# Patient Record
Sex: Female | Born: 1980 | Race: Black or African American | Hispanic: No | Marital: Single | Smoking: Current every day smoker
Health system: Southern US, Community
[De-identification: ages and names within clinical notes are randomized; demographics above are authoritative.]

## PROBLEM LIST (undated history)

## (undated) DIAGNOSIS — E119 Type 2 diabetes mellitus without complications: Secondary | ICD-10-CM

## (undated) DIAGNOSIS — E785 Hyperlipidemia, unspecified: Secondary | ICD-10-CM

## (undated) HISTORY — DX: Type 2 diabetes mellitus without complications: E11.9

## (undated) HISTORY — DX: Hyperlipidemia, unspecified: E78.5

---

## 2001-06-20 ENCOUNTER — Emergency Department (HOSPITAL_COMMUNITY): Admission: EM | Admit: 2001-06-20 | Discharge: 2001-06-20 | Payer: Self-pay | Admitting: Emergency Medicine

## 2001-09-28 ENCOUNTER — Emergency Department (HOSPITAL_COMMUNITY): Admission: EM | Admit: 2001-09-28 | Discharge: 2001-09-28 | Payer: Self-pay | Admitting: Emergency Medicine

## 2002-04-04 ENCOUNTER — Other Ambulatory Visit: Admission: RE | Admit: 2002-04-04 | Discharge: 2002-04-04 | Payer: Self-pay

## 2002-06-11 ENCOUNTER — Emergency Department (HOSPITAL_COMMUNITY): Admission: EM | Admit: 2002-06-11 | Discharge: 2002-06-11 | Payer: Self-pay | Admitting: Internal Medicine

## 2002-06-21 ENCOUNTER — Encounter (HOSPITAL_COMMUNITY): Admission: RE | Admit: 2002-06-21 | Discharge: 2002-07-21 | Payer: Self-pay | Admitting: Preventative Medicine

## 2003-08-01 ENCOUNTER — Emergency Department (HOSPITAL_COMMUNITY): Admission: EM | Admit: 2003-08-01 | Discharge: 2003-08-01 | Payer: Self-pay | Admitting: Emergency Medicine

## 2003-12-04 ENCOUNTER — Emergency Department (HOSPITAL_COMMUNITY): Admission: EM | Admit: 2003-12-04 | Discharge: 2003-12-04 | Payer: Self-pay | Admitting: Emergency Medicine

## 2004-04-18 ENCOUNTER — Ambulatory Visit (HOSPITAL_COMMUNITY): Admission: AD | Admit: 2004-04-18 | Discharge: 2004-04-18 | Payer: Self-pay | Admitting: Obstetrics and Gynecology

## 2004-05-01 ENCOUNTER — Ambulatory Visit (HOSPITAL_COMMUNITY): Admission: AD | Admit: 2004-05-01 | Discharge: 2004-05-01 | Payer: Self-pay | Admitting: Obstetrics and Gynecology

## 2004-05-02 ENCOUNTER — Inpatient Hospital Stay (HOSPITAL_COMMUNITY): Admission: AD | Admit: 2004-05-02 | Discharge: 2004-05-03 | Payer: Self-pay | Admitting: Obstetrics and Gynecology

## 2004-10-05 ENCOUNTER — Emergency Department (HOSPITAL_COMMUNITY): Admission: EM | Admit: 2004-10-05 | Discharge: 2004-10-05 | Payer: Self-pay | Admitting: Emergency Medicine

## 2005-08-11 ENCOUNTER — Emergency Department (HOSPITAL_COMMUNITY): Admission: EM | Admit: 2005-08-11 | Discharge: 2005-08-11 | Payer: Self-pay | Admitting: Emergency Medicine

## 2006-12-22 ENCOUNTER — Emergency Department (HOSPITAL_COMMUNITY): Admission: EM | Admit: 2006-12-22 | Discharge: 2006-12-22 | Payer: Self-pay | Admitting: Emergency Medicine

## 2007-01-12 ENCOUNTER — Ambulatory Visit (HOSPITAL_COMMUNITY): Admission: RE | Admit: 2007-01-12 | Discharge: 2007-01-12 | Payer: Self-pay | Admitting: Obstetrics & Gynecology

## 2007-02-23 ENCOUNTER — Emergency Department (HOSPITAL_COMMUNITY): Admission: EM | Admit: 2007-02-23 | Discharge: 2007-02-24 | Payer: Self-pay | Admitting: Emergency Medicine

## 2007-08-09 ENCOUNTER — Other Ambulatory Visit: Admission: RE | Admit: 2007-08-09 | Discharge: 2007-08-09 | Payer: Self-pay | Admitting: Obstetrics and Gynecology

## 2007-09-04 ENCOUNTER — Emergency Department (HOSPITAL_COMMUNITY): Admission: EM | Admit: 2007-09-04 | Discharge: 2007-09-04 | Payer: Self-pay | Admitting: Emergency Medicine

## 2008-08-23 ENCOUNTER — Other Ambulatory Visit: Admission: RE | Admit: 2008-08-23 | Discharge: 2008-08-23 | Payer: Self-pay | Admitting: Obstetrics & Gynecology

## 2009-10-14 ENCOUNTER — Other Ambulatory Visit: Admission: RE | Admit: 2009-10-14 | Discharge: 2009-10-14 | Payer: Self-pay | Admitting: Obstetrics and Gynecology

## 2010-06-24 ENCOUNTER — Emergency Department (HOSPITAL_COMMUNITY): Admission: EM | Admit: 2010-06-24 | Discharge: 2010-06-24 | Payer: Self-pay | Admitting: Emergency Medicine

## 2011-01-06 ENCOUNTER — Other Ambulatory Visit: Payer: Self-pay | Admitting: Obstetrics & Gynecology

## 2011-01-06 ENCOUNTER — Other Ambulatory Visit (HOSPITAL_COMMUNITY)
Admission: RE | Admit: 2011-01-06 | Discharge: 2011-01-06 | Disposition: A | Discharge status: Home / Self Care | Payer: Medicaid Other | Source: Ambulatory Visit | Attending: Obstetrics & Gynecology | Admitting: Obstetrics & Gynecology

## 2011-01-06 DIAGNOSIS — Z01419 Encounter for gynecological examination (general) (routine) without abnormal findings: Secondary | ICD-10-CM | POA: Insufficient documentation

## 2011-01-27 ENCOUNTER — Emergency Department (HOSPITAL_COMMUNITY)
Admission: EM | Admit: 2011-01-27 | Discharge: 2011-01-27 | Disposition: A | Discharge status: Home / Self Care | Payer: Medicaid Other | Attending: Emergency Medicine | Admitting: Emergency Medicine

## 2011-01-27 DIAGNOSIS — J020 Streptococcal pharyngitis: Secondary | ICD-10-CM | POA: Insufficient documentation

## 2011-01-27 DIAGNOSIS — R61 Generalized hyperhidrosis: Secondary | ICD-10-CM | POA: Insufficient documentation

## 2011-01-27 DIAGNOSIS — R509 Fever, unspecified: Secondary | ICD-10-CM | POA: Insufficient documentation

## 2011-01-27 LAB — URINALYSIS, ROUTINE W REFLEX MICROSCOPIC
Glucose, UA: NEGATIVE mg/dL
Ketones, ur: NEGATIVE mg/dL
Leukocytes, UA: NEGATIVE
Nitrite: NEGATIVE
pH: 6.5 (ref 5.0–8.0)

## 2011-01-27 LAB — POCT PREGNANCY, URINE: Preg Test, Ur: NEGATIVE

## 2011-01-27 LAB — URINE MICROSCOPIC-ADD ON

## 2011-01-30 LAB — POCT PREGNANCY, URINE: Preg Test, Ur: NEGATIVE

## 2011-04-03 NOTE — Discharge Summary (Signed)
Brittney Caldwell, Brittney Caldwell                           ACCOUNT NO.:  [id number]   MEDICAL RECORD NO.:  [id number]                   PATIENT TYPE:  INP   LOCATION:  A416                                 FACILITY:  [facility]   PHYSICIAN:  Tilda Burrow, M.D.              DATE OF BIRTH:  [birth year]   DATE OF ADMISSION:  05/01/2004  DATE OF DISCHARGE:  05/03/2004                                 DISCHARGE SUMMARY   ADMISSION DIAGNOSES:  Pregnancy 28 weeks 5 days, recurrent preterm labor.   DISCHARGE DIAGNOSES:  Pregnancy 28 weeks 5 days, recurrent preterm labor.  Premature preterm rupture of membranes.  Trichomoniasis vaginitis.   DISPOSITION:  Transfer to [facility], [place] for delivery  management.   HOSPITAL SUMMARY:  This 30 year old female, gravida 2, para 2-0-1-0, due  February 11, 2004 was admitted to labor and delivery just before midnight on  the evening of May 01, 2004 after presenting with uterine contractions and  pain described as a level of 8/10.  No bleeding, membrane rupture or fever.  The patient had been discharged from [facility] on April 30, 2004 where she had been an inpatient since April 18, 2004 due to preterm  labor noted on office visit with cervical dilation to 4 cm.  She had been  treated until group B strep cultures returned negative and magnesium sulfate  was given for tocolytics and she had received betamethasone for lung  maturity enhancement.  She had presented on the morning of May 01, 2004 to  our office for resumption of care, was sent to labor and delivery with  contractions were noted at noon on May 01, 2004.  Unfortunately, she went  home and returned 12 hours later with pain described as 8/10.  Cervical exam  showed the cervix to be 2 cm, 80%, -1 vertex with membranes intact.  She was  admitted for a repeat tocolysis.   HOSPITAL COURSE:  The patient was admitted, afebrile with laboratory data  including a white count of 15,500 with 80  neutrophils and a fetal heart rate  in the 140's, afebrile.  Temperature 98.8.  External monitoring showed  uterine contractions which responded to magnesium sulfate tocolysis which  was initiated at 2 g per hour after a 4 g loading dose.  This was increased  to 3 g per hour and the patient had improved control of uterine contractions  which were still infrequent every five minutes, untenable with type  perceived as a 3/10 by the patient.  She was placed on antibiotics after a  group B strep culture obtained.  Group B strep culture returned positive for  GBS.  This returned after the patient was discharged.   The patient had approximately 24 hours of observation and then developed a  heavy vaginal discharge.  Microscopic exam was obtained and showed perfuse  Trichomoniasis.  She went onto spontaneously rupture shortly after  collection of the wet prep specimen.  She was subsequently confirmed as  being nitrazine positive with clear generous fluid.  Ultrasound had  previously confirmed vertex presentation.  Contact was made with [facility] where Dr. Conni Slipper accepted the patient in  transfer.  She was transferred on continued magnesium sulfate at 3 g per  hour and for continued management and vaginal delivery.   ADDENDUM:  The patient's white count improved during her stay with second  CBC on hospital day #2 showing a white count of 12,200 but with continued  leukocytosis with 83 neutrophils, no bands.     ___________________________________________                                         Tilda Burrow, M.D.   JVF/MEDQ  D:  05/15/2004  T:  05/15/2004  Job:  [id number]

## 2011-04-03 NOTE — Op Note (Signed)
Brittney Caldwell, Brittney Caldwell                 ACCOUNT NO.:  [id number]   MEDICAL RECORD NO.:  [id number]          PATIENT TYPE:  AMB   LOCATION:  DAY                           FACILITY:  [facility]   PHYSICIAN:  Lazaro Arms, M.D.   DATE OF BIRTH:  1980-12-17   DATE OF PROCEDURE:  01/12/2007  DATE OF DISCHARGE:                               OPERATIVE REPORT   PREOPERATIVE DIAGNOSIS:  High grade squamous intraepithelial lesion.   POSTOPERATIVE DIAGNOSIS:  High grade squamous intraepithelial lesion.   OPERATION PERFORMED:  Laser ablation of the cervix.   SURGEON:  Lazaro Arms, M.D.   ANESTHESIA:  General endotracheal.   FINDINGS:  The patient had a colposcopic directed biopsies done in the  office which showed a lesion in the squamocolumnar junction close to the  endocervical canal consistent with high grade dysplasia.  As a result,  she is admitted for laser ablation of the cervix.  Today this was  confirmed by repeat colposcopy.   DESCRIPTION OF PROCEDURE:  The patient was taken to the operating room  and placed in the supine position, underwent general endotracheal  anesthesia, placed in dorsal lithotomy position.  Graves speculum was  placed.  Colposcopy was performed using the microscope and 3% acetic  acid.  The previous changes were seen.  The holmium laser was used and  placed in a rate of 20 and a power of 1.5 and a large margin was  obtained around the cervical dysplasia, 5 mm coning down to about 9 mm  centrally ina conical fashion.  The patient tolerated the procedure  well.  She had no blood loss and was taken to the recovery room in good  and stable condition.  All counts correct.      Lazaro Arms, M.D.  Electronically Signed     LHE/MEDQ  D:  01/12/2007  T:  01/12/2007  Job:  [id number]

## 2011-04-03 NOTE — H&P (Signed)
Brittney Caldwell, Brittney Caldwell                           ACCOUNT NO.:  [id number]   MEDICAL RECORD NO.:  [id number]                   PATIENT TYPE:  INP   LOCATION:  A416                                 FACILITY:  [facility]   PHYSICIAN:  Tilda Burrow, M.D.              DATE OF BIRTH:  [birth year]   DATE OF ADMISSION:  05/01/2004  DATE OF DISCHARGE:                                HISTORY & PHYSICAL   ADMITTING DIAGNOSES:  1. Pregnancy, 28 weeks 5 days.  2. Recurrent preterm labor.   HISTORY OF PRESENT ILLNESS:  This 30 year old female, gravida 2, para 2-0-1-  0, LMP February 11, 2004, placing menstrual [facility] July 22, 2004 with  corresponding 8-week ultrasound and 20-week ultrasound, is admitted at 28  weeks 5 days after presenting to labor and delivery just before midnight  complaining of uterine contractions and pain described as level 8/10 without  bleeding, without rupture of membranes and without fever.  She had been seen  earlier in the day for assessment in [facility] office after return  to this community from [facility].  She was discharged on  April 30, 2004 from [place], where she had been an inpatient from admission  on April 18, 2004 to discharge on April 30, 2004 due to preterm labor.  She had  been admitted on April 18, 2004 with cervical dilatation to 2-3 cm, very  posterior.  She was treated with betamethasone x2 days as antibiotic until  group B strep cultures returned negative, magnesium sulfate for tocolysis  and then had been maintained on bedrest until discharge yesterday.  She came  to our office for reestablishment of care in our facility.  She was sent to  labor and delivery at mid-afternoon on May 01, 2004, where monitoring  showed no contraction pattern identifiable and a fetal heart rate pattern  appropriate for 28-1/2 weeks' gestation.  She was sent home with absolutely  no pain and returned approximately then 12 hours later with discomfort  described  as 8/10, with external monitoring showing mild uterine  contractions and cervical exam showed the cervix to be 2 cm, 80%, -1 and  vertex presentation, mid-position cervix.  No membrane rupture existed.  The  patient is admitted for repeat tocolysis.  She was given terbutaline  initially and after 4 failed doses, was converted to magnesium sulfate at 2  g p.r.n., increasing to 3 g per hour to achieve adequate tocolysis.   PAST MEDICAL HISTORY:  Benign.   SURGICAL HISTORY:  Negative.   ALLERGIES:  PENICILLIN caused unknown rash.   SOCIAL HISTORY:  Single, unemployed, lives with family members.  She denies  recreational drugs but was positive for urine drug screen for marijuana at  her initial intake prenatal visit.   ADDITIONAL PRENATAL LABORATORIES:  Additional prenatal labs include blood  type A-positive, rubella immunity present; hemoglobin 14, hematocrit 43;  hepatitis, HIV, GC  and Chlamydia all negative.  She is positive for HSV-II  antibodies.  She had a Pap smear that was positive ASCUS, high risk, with  colposcopic exam showing anterior lip changes with plans to follow up 3  months postpartum.   PHYSICAL EXAMINATION:  GENERAL:  General exam shows an overweight, large-  framed African American female, alert and oriented x3, with GYN exam notable  for a 30-cm fundal height, laboratory work including hemoglobin 10.3,  hematocrit 29.5.  Magnesium sulfate level was drawn on May 02, 2004 at 4.2,  appropriate for magnesium sulfate tocolysis at 3 g per hour.   PLAN:  Continued admission and monitoring.  Good prognosis for stopping  labor, at least temporarily.  Will not repeat betamethasone dosing.  Will  consider restarting antibiotics if fever is noted.  Will request records  from [facility].     ___________________________________________                                         Tilda Burrow, M.D.   JVF/MEDQ  D:  05/02/2004  T:  05/03/2004  Job:  [id number]

## 2011-08-26 LAB — DIFFERENTIAL
Eosinophils Absolute: 0.1
Eosinophils Relative: 1
Monocytes Absolute: 0.5
Neutro Abs: 6.8

## 2011-08-26 LAB — CBC
HCT: 41.3
Platelets: 206
RBC: 4.6
WBC: 10.4

## 2012-01-08 ENCOUNTER — Other Ambulatory Visit (HOSPITAL_COMMUNITY)
Admission: RE | Admit: 2012-01-08 | Discharge: 2012-01-08 | Disposition: A | Discharge status: Home / Self Care | Payer: Medicaid Other | Source: Ambulatory Visit | Attending: Obstetrics & Gynecology | Admitting: Obstetrics & Gynecology

## 2012-01-08 ENCOUNTER — Other Ambulatory Visit: Payer: Self-pay | Admitting: Obstetrics & Gynecology

## 2012-01-08 DIAGNOSIS — Z01419 Encounter for gynecological examination (general) (routine) without abnormal findings: Secondary | ICD-10-CM | POA: Insufficient documentation

## 2013-01-23 ENCOUNTER — Emergency Department (HOSPITAL_COMMUNITY)
Admission: EM | Admit: 2013-01-23 | Discharge: 2013-01-23 | Disposition: A | Discharge status: Home / Self Care | Payer: Medicaid Other | Attending: Emergency Medicine | Admitting: Emergency Medicine

## 2013-01-23 ENCOUNTER — Encounter (HOSPITAL_COMMUNITY): Payer: Self-pay | Admitting: *Deleted

## 2013-01-23 DIAGNOSIS — J209 Acute bronchitis, unspecified: Secondary | ICD-10-CM | POA: Insufficient documentation

## 2013-01-23 DIAGNOSIS — R51 Headache: Secondary | ICD-10-CM | POA: Insufficient documentation

## 2013-01-23 DIAGNOSIS — R509 Fever, unspecified: Secondary | ICD-10-CM | POA: Insufficient documentation

## 2013-01-23 DIAGNOSIS — R093 Abnormal sputum: Secondary | ICD-10-CM | POA: Insufficient documentation

## 2013-01-23 DIAGNOSIS — F17200 Nicotine dependence, unspecified, uncomplicated: Secondary | ICD-10-CM | POA: Insufficient documentation

## 2013-01-23 DIAGNOSIS — J3489 Other specified disorders of nose and nasal sinuses: Secondary | ICD-10-CM | POA: Insufficient documentation

## 2013-01-23 MED ORDER — HYDROCOD POLST-CHLORPHEN POLST 10-8 MG/5ML PO LQCR: 5.0000 mL | Freq: Two times a day (BID) | ORAL | Status: DC | PRN

## 2013-01-23 MED ORDER — AZITHROMYCIN 250 MG PO TABS: 250.0000 mg | ORAL_TABLET | Freq: Every day | ORAL | Status: DC

## 2013-01-23 NOTE — ED Notes (Signed)
Cough , abd pain, sob at times.  Has been exposed to strep throat.   N/v,

## 2013-01-23 NOTE — ED Provider Notes (Signed)
History     CSN: [id number]  Arrival date & time 01/23/13  2041   First MD Initiated Contact with Patient 01/23/13 2057      Chief Complaint  Patient presents with  . Cough    (Consider location/radiation/quality/duration/timing/severity/associated sxs/prior treatment) Patient is a 32 y.o. female presenting with cough. The history is provided by the patient.  Cough Cough characteristics:  Productive Sputum characteristics:  Yellow Severity:  Moderate Onset quality:  Gradual Duration:  7 days Timing:  Constant Progression:  Worsening Chronicity:  New Smoker: no   Context: sick contacts   Relieved by:  Nothing Worsened by:  Nothing tried Ineffective treatments:  Decongestant and cough suppressants Associated symptoms: fever, headaches and sinus congestion   Associated symptoms: no chest pain     History reviewed. No pertinent past medical history.  History reviewed. No pertinent past surgical history.  History reviewed. No pertinent family history.  History  Substance Use Topics  . Smoking status: Current Every Day Smoker    Types: Cigarettes  . Smokeless tobacco: Not on file  . Alcohol Use: No    OB History   Grav Para Term Preterm Abortions TAB SAB Ect Mult Living                  Review of Systems  Constitutional: Positive for fever.  Respiratory: Positive for cough.   Cardiovascular: Negative for chest pain.  Neurological: Positive for headaches.  All other systems reviewed and are negative.    Allergies  Penicillins  Home Medications  No current outpatient prescriptions on file.  BP 134/77  Pulse 93  Temp(Src) 98.8 F (37.1 C) (Oral)  Resp 20  Ht 5' 4.5" (1.638 m)  Wt 253 lb (114.76 kg)  BMI 42.77 kg/m2  SpO2 98%  LMP 12/27/2012  Physical Exam  Nursing note and vitals reviewed. Constitutional: She is oriented to person, place, and time. She appears well-developed and well-nourished. No distress.  HENT:  Head: Normocephalic and  atraumatic.  Neck: Normal range of motion. Neck supple.  Cardiovascular: Normal rate and regular rhythm.  Exam reveals no gallop and no friction rub.   No murmur heard. Pulmonary/Chest: Effort normal and breath sounds normal. No respiratory distress. She has no wheezes.  Abdominal: Soft. Bowel sounds are normal. She exhibits no distension. There is no tenderness.  Musculoskeletal: Normal range of motion.  Neurological: She is alert and oriented to person, place, and time.  Skin: Skin is warm and dry. She is not diaphoretic.    ED Course  Procedures (including critical care time)  Labs Reviewed - No data to display No results found.   No diagnosis found.    MDM  Persistent yellow sputum despite otc meds.  Will treat with zithromax, cough syrup.  Return prn if she worsens.        Geoffery Lyons, MD 01/23/13 2110

## 2014-02-18 ENCOUNTER — Encounter (HOSPITAL_COMMUNITY): Payer: Self-pay | Admitting: Emergency Medicine

## 2014-02-18 ENCOUNTER — Emergency Department (HOSPITAL_COMMUNITY)
Admission: EM | Admit: 2014-02-18 | Discharge: 2014-02-18 | Disposition: A | Discharge status: Home / Self Care | Payer: Medicaid Other | Attending: Emergency Medicine | Admitting: Emergency Medicine

## 2014-02-18 DIAGNOSIS — Z880 Allergy status to penicillin: Secondary | ICD-10-CM | POA: Insufficient documentation

## 2014-02-18 DIAGNOSIS — Z20818 Contact with and (suspected) exposure to other bacterial communicable diseases: Secondary | ICD-10-CM

## 2014-02-18 DIAGNOSIS — Z2089 Contact with and (suspected) exposure to other communicable diseases: Secondary | ICD-10-CM | POA: Insufficient documentation

## 2014-02-18 DIAGNOSIS — F17200 Nicotine dependence, unspecified, uncomplicated: Secondary | ICD-10-CM | POA: Insufficient documentation

## 2014-02-18 DIAGNOSIS — J069 Acute upper respiratory infection, unspecified: Secondary | ICD-10-CM | POA: Insufficient documentation

## 2014-02-18 DIAGNOSIS — Z79899 Other long term (current) drug therapy: Secondary | ICD-10-CM | POA: Insufficient documentation

## 2014-02-18 MED ORDER — CETIRIZINE-PSEUDOEPHEDRINE ER 5-120 MG PO TB12: 1.0000 | ORAL_TABLET | Freq: Two times a day (BID) | ORAL | Status: DC

## 2014-02-18 MED ORDER — AZITHROMYCIN 250 MG PO TABS
500.0000 mg | ORAL_TABLET | Freq: Once | ORAL | Status: AC
  Administered 2014-02-18: 500 mg via ORAL
  Filled 2014-02-18: qty 2

## 2014-02-18 MED ORDER — AZITHROMYCIN 250 MG PO TABS: ORAL_TABLET | ORAL | Status: DC

## 2014-02-18 NOTE — Discharge Instructions (Signed)
Upper Respiratory Infection, Adult An upper respiratory infection (URI) is also known as the common cold. It is often caused by a type of germ (virus). Colds are easily spread (contagious). You can pass it to others by kissing, coughing, sneezing, or drinking out of the same glass. Usually, you get better in 1 or 2 weeks.  HOME CARE   Only take medicine as told by your doctor.  Use a warm mist humidifier or breathe in steam from a hot shower.  Drink enough water and fluids to keep your pee (urine) clear or pale yellow.  Get plenty of rest.  Return to work when your temperature is back to normal or as told by your doctor. You may use a face mask and wash your hands to stop your cold from spreading. GET HELP RIGHT AWAY IF:   After the first few days, you feel you are getting worse.  You have questions about your medicine.  You have chills, shortness of breath, or brown or red spit (mucus).  You have yellow or brown snot (nasal discharge) or pain in the face, especially when you bend forward.  You have a fever, puffy (swollen) neck, pain when you swallow, or white spots in the back of your throat.  You have a bad headache, ear pain, sinus pain, or chest pain.  You have a high-pitched whistling sound when you breathe in and out (wheezing).  You have a lasting cough or cough up blood.  You have sore muscles or a stiff neck. MAKE SURE YOU:   Understand these instructions.  Will watch your condition.  Will get help right away if you are not doing well or get worse. Document Released: 04/20/2008 Document Revised: 01/25/2012 Document Reviewed: 03/09/2011 [facility] Patient Information 2014 [place], [place].   Take your next dose of zithromax tomorrow afternoon.  Rest,  Drink plenty of fluids.  Get rechecked for any worsened symptoms including worse pain,  High fever or shortness of breath.

## 2014-02-18 NOTE — ED Notes (Signed)
Pt c/o nasal congestion, cough that is productive with yellow sputum, chills, headache, chest tightness with coughin, dry itchy thraot for the past two days,

## 2014-02-18 NOTE — ED Provider Notes (Signed)
CSN: [id number]     Arrival date & time 02/18/14  1313 History  This chart was scribed for non-physician practitioner, Burgess Amor, PA-C,working with Juliet Rude. Rubin Payor, MD, by Karle Plumber, ED Scribe.  This patient was seen in room APFT21/APFT21 and the patient's care was started at 2:16 PM.  Chief Complaint  Patient presents with  . URI   The history is provided by the patient. No language interpreter was used.   HPI Comments:  Brittney Caldwell is a 33 y.o. obese female who presents to the Emergency Department complaining of productive cough of yellow phlegm, nasal congestion, and sore throat that started two days ago. Pt reports associated subjective fever, chills, and diaphoresis. She states she experienced some mild SOB last night. She states she has not taken anything for her symptoms. She reports sick contacts with her cousin whom has strep throat. She denies rhinorrhea, abdominal pain, or vomiting. She reports her last   History reviewed. No pertinent past medical history. History reviewed. No pertinent past surgical history. No family history on file. History  Substance Use Topics  . Smoking status: Current Every Day Smoker    Types: Cigarettes  . Smokeless tobacco: Not on file  . Alcohol Use: No   OB History   Grav Para Term Preterm Abortions TAB SAB Ect Mult Living                 Review of Systems  Constitutional: Positive for fever (subjective), chills and diaphoresis.  HENT: Positive for congestion and sore throat. Negative for ear pain, rhinorrhea, sinus pressure, trouble swallowing and voice change.   Eyes: Negative for discharge.  Respiratory: Positive for cough. Negative for shortness of breath, wheezing and stridor.   Cardiovascular: Negative for chest pain.  Gastrointestinal: Negative for abdominal pain.  Genitourinary: Negative.     Allergies  Penicillins  Home Medications   Current Outpatient Rx  Name  Route  Sig  Dispense  Refill  . VITAMIN E PO    Oral   Take 1 tablet by mouth daily.         Marland Kitchen azithromycin (ZITHROMAX Z-PAK) 250 MG tablet      1 tab PO daily for 4 days   4 each   0   . cetirizine-pseudoephedrine (ZYRTEC-D) 5-120 MG per tablet   Oral   Take 1 tablet by mouth 2 (two) times daily.   20 tablet   0    Triage Vitals: BP 130/73  Pulse 98  Temp(Src) 98.3 F (36.8 C) (Oral)  Resp 16  Ht 5' 4.5" (1.638 m)  Wt 240 lb (108.863 kg)  BMI 40.57 kg/m2  SpO2 98%  LMP 02/11/2014 Physical Exam  Nursing note and vitals reviewed. Constitutional: She is oriented to person, place, and time. She appears well-developed and well-nourished.  HENT:  Head: Normocephalic and atraumatic.  Right Ear: Tympanic membrane, external ear and ear canal normal.  Left Ear: Tympanic membrane, external ear and ear canal normal.  Nose: Mucosal edema and rhinorrhea present.  Mouth/Throat: Uvula is midline and mucous membranes are normal. Posterior oropharyngeal erythema present. No oropharyngeal exudate, posterior oropharyngeal edema or tonsillar abscesses.  Mildly bilateral tonsillar erythema without exudate. No edema.  Eyes: Conjunctivae are normal.  Cardiovascular: Normal rate, regular rhythm and normal heart sounds.  Exam reveals no gallop and no friction rub.   No murmur heard. Pulmonary/Chest: Effort normal and breath sounds normal. No respiratory distress. She has no wheezes. She has no rales.  Abdominal: Soft.  There is no tenderness.  Musculoskeletal: Normal range of motion.  Lymphadenopathy:    She has no cervical adenopathy.  Neurological: She is alert and oriented to person, place, and time.  Skin: Skin is warm and dry. No rash noted.  Psychiatric: She has a normal mood and affect.    ED Course  Procedures (including critical care time) DIAGNOSTIC STUDIES: Oxygen Saturation is 98% on RA, normal by my interpretation.   COORDINATION OF CARE: 2:22 PM- Will treat for strep secondary to exposure. Pt verbalizes understanding  and agrees to plan.  Medications  azithromycin (ZITHROMAX) tablet 500 mg (500 mg Oral Given 02/18/14 1448)    Labs Review Labs Reviewed - No data to display Imaging Review No results found.   EKG Interpretation None      MDM   Final diagnoses:  Acute URI  Strep throat exposure    Pt prescribed zithromax due to sx and positive exposure, although sx suspicious for viral uri.  Encouraged rest, increased fluid intake, tylenol or motrin for throat pain.  Prescribed zyrtec d for nasal sx.  I personally performed the services described in this documentation, which was scribed in my presence. The recorded information has been reviewed and is accurate.    Burgess AmorJulie Corianna Avallone, PA-C 02/19/14 [phone]

## 2014-02-18 NOTE — ED Notes (Signed)
Julie PA at bedside,  

## 2014-02-20 NOTE — ED Provider Notes (Signed)
Medical screening examination/treatment/procedure(s) were performed by non-physician practitioner and as supervising physician I was immediately available for consultation/collaboration.   EKG Interpretation None       Rick Carruthers R. Jerine Surles, MD 02/20/14 1459 

## 2014-11-19 ENCOUNTER — Other Ambulatory Visit (HOSPITAL_COMMUNITY)
Admission: RE | Admit: 2014-11-19 | Discharge: 2014-11-19 | Disposition: A | Discharge status: Home / Self Care | Payer: Medicaid Other | Source: Ambulatory Visit | Attending: Obstetrics & Gynecology | Admitting: Obstetrics & Gynecology

## 2014-11-19 ENCOUNTER — Ambulatory Visit (INDEPENDENT_AMBULATORY_CARE_PROVIDER_SITE_OTHER): Payer: Medicaid Other | Admitting: Obstetrics & Gynecology

## 2014-11-19 ENCOUNTER — Encounter: Payer: Self-pay | Admitting: Obstetrics & Gynecology

## 2014-11-19 VITALS — BP 120/80 | Ht 62.2 in | Wt 247.0 lb

## 2014-11-19 DIAGNOSIS — Z0000 Encounter for general adult medical examination without abnormal findings: Secondary | ICD-10-CM

## 2014-11-19 DIAGNOSIS — Z01419 Encounter for gynecological examination (general) (routine) without abnormal findings: Secondary | ICD-10-CM | POA: Diagnosis present

## 2014-11-19 DIAGNOSIS — Z1151 Encounter for screening for human papillomavirus (HPV): Secondary | ICD-10-CM | POA: Insufficient documentation

## 2014-11-19 MED ORDER — NORETHINDRONE ACET-ETHINYL EST 1-20 MG-MCG PO TABS: 1.0000 | ORAL_TABLET | Freq: Every day | ORAL | Status: DC

## 2014-11-19 NOTE — Progress Notes (Signed)
Patient ID: Brittney Caldwell, female   DOB: [birth year], 34 y.o.   MRN: [id number] Subjective:     Brittney Caldwell is a 34 y.o. female here for a routine exam.  Patient's last menstrual period was 10/20/2014. No obstetric history on file. Birth Control Method:  none Menstrual Calendar(currently): regular  Current complaints: none.   Current acute medical issues:  none   Recent Gynecologic History Patient's last menstrual period was 10/20/2014. Last Pap: 2014,  normal Last mammogram: ,    History reviewed. No pertinent past medical history.  History reviewed. No pertinent past surgical history.  OB History    No data available      History   Social History  . Marital Status: Single    Spouse Name: N/A    Number of Children: N/A  . Years of Education: N/A   Social History Main Topics  . Smoking status: Current Every Day Smoker    Types: Cigarettes  . Smokeless tobacco: None  . Alcohol Use: No  . Drug Use: No  . Sexual Activity: Yes    Birth Control/ Protection: None   Other Topics Concern  . None   Social History Narrative    Family History  Problem Relation Age of Onset  . Diabetes Paternal Grandfather   . Diabetes Maternal Grandmother   . Hypertension Father     Current outpatient prescriptions: azithromycin (ZITHROMAX Z-PAK) 250 MG tablet, 1 tab PO daily for 4 days (Patient not taking: Reported on 11/19/2014), Disp: 4 each, Rfl: 0;  cetirizine-pseudoephedrine (ZYRTEC-D) 5-120 MG per tablet, Take 1 tablet by mouth 2 (two) times daily. (Patient not taking: Reported on 11/19/2014), Disp: 20 tablet, Rfl: 0;  VITAMIN E PO, Take 1 tablet by mouth daily., Disp: , Rfl:   Review of Systems  Review of Systems  Constitutional: Negative for fever, chills, weight loss, malaise/fatigue and diaphoresis.  HENT: Negative for hearing loss, ear pain, nosebleeds, congestion, sore throat, neck pain, tinnitus and ear discharge.   Eyes: Negative for blurred vision, double vision,  photophobia, pain, discharge and redness.  Respiratory: Negative for cough, hemoptysis, sputum production, shortness of breath, wheezing and stridor.   Cardiovascular: Negative for chest pain, palpitations, orthopnea, claudication, leg swelling and PND.  Gastrointestinal: negative for abdominal pain. Negative for heartburn, nausea, vomiting, diarrhea, constipation, blood in stool and melena.  Genitourinary: Negative for dysuria, urgency, frequency, hematuria and flank pain.  Musculoskeletal: Negative for myalgias, back pain, joint pain and falls.  Skin: Negative for itching and rash.  Neurological: Negative for dizziness, tingling, tremors, sensory change, speech change, focal weakness, seizures, loss of consciousness, weakness and headaches.  Endo/Heme/Allergies: Negative for environmental allergies and polydipsia. Does not bruise/bleed easily.  Psychiatric/Behavioral: Negative for depression, suicidal ideas, hallucinations, memory loss and substance abuse. The patient is not nervous/anxious and does not have insomnia.        Objective:  Blood pressure 120/80, height 5' 2.2" (1.58 m), weight 247 lb (112.038 kg), last menstrual period 10/20/2014.   Physical Exam  Vitals reviewed. Constitutional: She is oriented to person, place, and time. She appears well-developed and well-nourished.  HENT:  Head: Normocephalic and atraumatic.        Right Ear: External ear normal.  Left Ear: External ear normal.  Nose: Nose normal.  Mouth/Throat: Oropharynx is clear and moist.  Eyes: Conjunctivae and EOM are normal. Pupils are equal, round, and reactive to light. Right eye exhibits no discharge. Left eye exhibits no discharge. No scleral icterus.  Neck: Normal  range of motion. Neck supple. No tracheal deviation present. No thyromegaly present.  Cardiovascular: Normal rate, regular rhythm, normal heart sounds and intact distal pulses.  Exam reveals no gallop and no friction rub.   No murmur  heard. Respiratory: Effort normal and breath sounds normal. No respiratory distress. She has no wheezes. She has no rales. She exhibits no tenderness.  GI: Soft. Bowel sounds are normal. She exhibits no distension and no mass. There is no tenderness. There is no rebound and no guarding.  Genitourinary:  Breasts no masses skin changes or nipple changes bilaterally      Vulva is normal without lesions Vagina is pink moist without discharge Cervix normal in appearance and pap is done Uterus is normal size shape and contour Adnexa is negative with normal sized ovaries    Musculoskeletal: Normal range of motion. She exhibits no edema and no tenderness.  Neurological: She is alert and oriented to person, place, and time. She has normal reflexes. She displays normal reflexes. No cranial nerve deficit. She exhibits normal muscle tone. Coordination normal.  Skin: Skin is warm and dry. No rash noted. No erythema. No pallor.  Psychiatric: She has a normal mood and affect. Her behavior is normal. Judgment and thought content normal.       Assessment:    Healthy female exam.    Plan:    Contraception: none. Follow up in: 1 year.

## 2014-11-20 LAB — CYTOLOGY - PAP

## 2015-01-17 ENCOUNTER — Ambulatory Visit: Payer: Medicaid Other | Admitting: Obstetrics & Gynecology

## 2015-01-22 ENCOUNTER — Encounter: Payer: Self-pay | Admitting: Obstetrics & Gynecology

## 2015-01-22 ENCOUNTER — Ambulatory Visit (INDEPENDENT_AMBULATORY_CARE_PROVIDER_SITE_OTHER): Payer: Medicaid Other | Admitting: Obstetrics & Gynecology

## 2015-01-22 VITALS — BP 120/80 | HR 72 | Ht 64.5 in | Wt 246.0 lb

## 2015-01-22 DIAGNOSIS — N939 Abnormal uterine and vaginal bleeding, unspecified: Secondary | ICD-10-CM

## 2015-01-22 DIAGNOSIS — Z3202 Encounter for pregnancy test, result negative: Secondary | ICD-10-CM | POA: Diagnosis not present

## 2015-01-22 LAB — POCT URINE PREGNANCY: PREG TEST UR: NEGATIVE

## 2015-01-22 MED ORDER — MEGESTROL ACETATE 40 MG PO TABS: ORAL_TABLET | ORAL | Status: DC

## 2015-01-22 MED ORDER — DESOGESTREL-ETHINYL ESTRADIOL 0.15-30 MG-MCG PO TABS: 1.0000 | ORAL_TABLET | Freq: Every day | ORAL | Status: DC

## 2015-01-22 NOTE — Progress Notes (Signed)
Patient ID: Brittney Caldwell, female   DOB: 11/20/1980, 34 y.o.   MRN: [id number]  Chief Complaint  Patient presents with  . abnormal vaginal bleeding    pt states she has not been taking her birth control pills    Blood pressure 120/80, pulse 72, height 5' 4.5" (1.638 m), weight 246 lb (111.585 kg), last menstrual period 12/24/2014.  Pt was supposed to start pills but did not because "her period hasn't gotten normal yet" This has been her pattern for the last few [facility], sometimes heavy with associated cramping We discussed menstrual regulation with pills which is our goal back in January and the fact that she still wants a BCM Exam was normal in January  Gen WDWN in NAD  Will begin megestrol therapy to synchronize her endometrium then Begin 30 mic desogestrel pill for [facility] and for on going cycle control  Will see back for yearly or call/come back if this does not regulate her bleeding issues     Face to face time:  15  Greater than 50% of the visit time was spent in counseling and coordination of care with the patient.  The summary and outline of the counseling and care coordination is summarized in the note above.   All questions were answered.

## 2015-09-12 ENCOUNTER — Encounter: Payer: Self-pay | Admitting: Obstetrics and Gynecology

## 2015-09-12 ENCOUNTER — Ambulatory Visit (INDEPENDENT_AMBULATORY_CARE_PROVIDER_SITE_OTHER): Payer: Medicaid Other | Admitting: Obstetrics and Gynecology

## 2015-09-12 VITALS — BP 120/76 | Ht 64.5 in

## 2015-09-12 DIAGNOSIS — R109 Unspecified abdominal pain: Secondary | ICD-10-CM

## 2015-09-12 DIAGNOSIS — R1012 Left upper quadrant pain: Secondary | ICD-10-CM

## 2015-09-12 MED ORDER — HYDROCODONE-ACETAMINOPHEN 5-325 MG PO TABS: 1.0000 | ORAL_TABLET | Freq: Four times a day (QID) | ORAL | Status: DC | PRN

## 2015-09-12 MED ORDER — CYCLOBENZAPRINE HCL 10 MG PO TABS: 10.0000 mg | ORAL_TABLET | Freq: Three times a day (TID) | ORAL | Status: DC | PRN

## 2015-09-12 NOTE — Progress Notes (Signed)
Patient ID: Brittney Caldwell, female   DOB: 08/14/1981, 34 y.o.   MRN: [id number] Pt here today for lower back pain. Pt states that she has had lower back on her left side for about 2 weeks. Pt states that she took a plan B last month and this month she started her period a week late. Pt states that the pain radiates to her lower stomach and back to her lower back. Pt states that she had this pain before she started her period. Pt states that the pain is there all the time and only lets up when pressure is applied.

## 2015-09-12 NOTE — Progress Notes (Signed)
   [facility] Visit  Patient name: Brittney Caldwell MRN [id number]  Date of birth: 01/15/1981  CC & HPI:  Brittney Caldwell is a 34 y.o. female presenting today for constant, moderate, left sided low back pain onset 2 weeks. Pt reports the pain radiates to her lower stomach. She denies any known trauma to her back or heavy lifting. She states she took plan B last month for the first time and this month she started her period a week late. Pt reports that applied pressure alleviates the pain. Pt notes that her father has a h/o kidney stones. She denies any fever, chills, nausea, vomiting or urinary symptoms   ROS:  10 Systems reviewed and all are negative for acute change except as noted in the HPI.   Pertinent History Reviewed:   Reviewed: Significant for no PMHx Medical        History reviewed. No pertinent past medical history.                            Surgical Hx:   History reviewed. No pertinent past surgical history. Medications: Reviewed & Updated - see associated section                      No current outpatient prescriptions on file.   Social History: Reviewed -  reports that she has been smoking Cigarettes.  She has a 3.75 pack-year smoking history. She has never used smokeless tobacco.  Objective Findings:  Vitals: Blood pressure 120/76, height 5' 4.5" (1.638 m), last menstrual period 09/09/2015.  Physical Examination: General appearance - alert, well appearing, and in no distress Mental status - alert, oriented to person, place, and time Abdomen - soft, nontender, nondistended, no masses or organomegaly no CVA tenderness, pressure over muscles provides her relief Neurological - alert, oriented, normal speech, no focal findings or movement disorder noted Extremities - peripheral pulses normal, no pedal edema, no clubbing or cyanosis Skin - normal coloration and turgor, no rashes, no suspicious skin lesions noted   Assessment & Plan:   A:  1. Left lower back pain  probable musculoskeletal origin 2. R/o kidneystone  P:  1. Renal US, cbc, and give rx for muscle relaxant   By signing my name below, I, Jarvis Morganaylor Kearsten Ginther, attest that this documentation has been prepared under the direction and in the presence of Tilda BurrowJohn Keiko Myricks V, MD. Electronically Signed: Jarvis Morganaylor Elizbeth Posa, ED Scribe. 09/12/2015. 9:35 AM.  I personally performed the services described in this documentation, which was SCRIBED in my presence. The recorded information has been reviewed and considered accurate. It has been edited as necessary during review. Tilda BurrowFERGUSON,Carmine Youngberg V, MD

## 2015-09-13 LAB — CBC WITH DIFFERENTIAL/PLATELET
BASOS ABS: 0 10*3/uL (ref 0.0–0.2)
Basos: 0 %
EOS (ABSOLUTE): 0.1 10*3/uL (ref 0.0–0.4)
Eos: 1 %
HEMOGLOBIN: 14.4 g/dL (ref 11.1–15.9)
Hematocrit: 42.6 % (ref 34.0–46.6)
IMMATURE GRANS (ABS): 0 10*3/uL (ref 0.0–0.1)
Immature Granulocytes: 0 %
LYMPHS: 34 %
Lymphocytes Absolute: 3.2 10*3/uL — ABNORMAL HIGH (ref 0.7–3.1)
MCH: 29.6 pg (ref 26.6–33.0)
MCHC: 33.8 g/dL (ref 31.5–35.7)
MCV: 88 fL (ref 79–97)
MONOCYTES: 5 %
Monocytes Absolute: 0.5 10*3/uL (ref 0.1–0.9)
NEUTROS ABS: 5.6 10*3/uL (ref 1.4–7.0)
Neutrophils: 60 %
PLATELETS: 229 10*3/uL (ref 150–379)
RBC: 4.86 x10E6/uL (ref 3.77–5.28)
RDW: 14.2 % (ref 12.3–15.4)
WBC: 9.4 10*3/uL (ref 3.4–10.8)

## 2015-09-13 LAB — BETA HCG QUANT (REF LAB): hCG Quant: <1 m[IU]/mL

## 2015-09-16 ENCOUNTER — Ambulatory Visit (HOSPITAL_COMMUNITY)
Admission: RE | Admit: 2015-09-16 | Discharge: 2015-09-16 | Disposition: A | Discharge status: Home / Self Care | Payer: Medicaid Other | Source: Ambulatory Visit | Attending: Obstetrics and Gynecology | Admitting: Obstetrics and Gynecology

## 2015-09-16 ENCOUNTER — Other Ambulatory Visit: Payer: Medicaid Other

## 2015-09-16 DIAGNOSIS — R109 Unspecified abdominal pain: Secondary | ICD-10-CM

## 2015-09-18 ENCOUNTER — Telehealth: Payer: Self-pay | Admitting: *Deleted

## 2015-09-18 NOTE — Telephone Encounter (Signed)
-----   Message from Tilda BurrowJohn Ferguson V, MD sent at 09/17/2015  9:25 PM EDT ----- No kidney abnormalities, but a 5.3 cm left ovarian cyst.  willl need recheck in 6 wk for resolution

## 2015-09-18 NOTE — Telephone Encounter (Signed)
Pt informed per Dr. Emelda FearFerguson, no kidney abnormalities but a 5.3 cm left ovarian cyst noted on US renal. Call transferred to front desk staff for an ultrasound appt and appt with Dr. Emelda FearFerguson.

## 2015-10-29 ENCOUNTER — Other Ambulatory Visit: Payer: Self-pay | Admitting: Obstetrics and Gynecology

## 2015-10-29 DIAGNOSIS — N83202 Unspecified ovarian cyst, left side: Secondary | ICD-10-CM

## 2015-10-30 ENCOUNTER — Ambulatory Visit (INDEPENDENT_AMBULATORY_CARE_PROVIDER_SITE_OTHER): Payer: Medicaid Other | Admitting: Obstetrics and Gynecology

## 2015-10-30 ENCOUNTER — Encounter: Payer: Self-pay | Admitting: Obstetrics and Gynecology

## 2015-10-30 ENCOUNTER — Ambulatory Visit (INDEPENDENT_AMBULATORY_CARE_PROVIDER_SITE_OTHER): Payer: Medicaid Other

## 2015-10-30 VITALS — BP 120/80 | HR 72 | Wt 242.0 lb

## 2015-10-30 DIAGNOSIS — N83201 Unspecified ovarian cyst, right side: Secondary | ICD-10-CM | POA: Diagnosis not present

## 2015-10-30 DIAGNOSIS — N83202 Unspecified ovarian cyst, left side: Secondary | ICD-10-CM

## 2015-10-30 NOTE — Progress Notes (Signed)
Patient ID: Brittney Caldwell, female   DOB: 08/21/1981, 34 y.o.   MRN: [id number]    [facility]  Patient name: Brittney Caldwell MRN [id number]  Date of birth: 11/11/1981  CC & HPI:  Brittney Caldwell is a 34 y.o. female G2P1A1 presenting today for follow-up on fertility issues. She states irregular menses as an associated symptom. She does not have menses every month. Pt reports that she has been trying to become pregnant intermittently for the last 7 years. She has been without birth control for 5 of those years. Her [place] today showed that an ovarian cyst on her left ovary has resolved. She is currently menstruating.    ROS:  10 Systems reviewed and all are negative for acute change except as noted in the HPI.  Pertinent History Reviewed:   Reviewed. Medical        History reviewed. No pertinent past medical history.                            Surgical Hx:   History reviewed. No pertinent past surgical history. Medications: Reviewed & Updated - see associated section                       Current outpatient prescriptions:  .  cyclobenzaprine (FLEXERIL) 10 MG tablet, Take 1 tablet (10 mg total) by mouth every 8 (eight) hours as needed for muscle spasms., Disp: 30 tablet, Rfl: 1 .  HYDROcodone-acetaminophen (NORCO/VICODIN) 5-325 MG tablet, Take 1 tablet by mouth every 6 (six) hours as needed. (Patient not taking: Reported on 10/30/2015), Disp: 30 tablet, Rfl: 0   Social History: Reviewed -  reports that she has been smoking Cigarettes.  She has a 3.75 pack-year smoking history. She has never used smokeless tobacco.  Objective Findings:  Vitals: Blood pressure 120/80, pulse 72, weight 242 lb (109.77 kg), last menstrual period 10/29/2015.  Physical Examination: General appearance - alert, well appearing, and in no distress, oriented to person, place, and time and overweight Mental status - alert, oriented to person, place, and time, normal mood, behavior, speech, dress, motor activity,  and thought processes  Discussed with pt results from Pelvic and Transvaginal US and fertility work-up. Pt had opportunity to ask questions and has no further questions at this time.  Greater than 50% was spent in counseling and coordination of care with the patient. Discussion time: 25 minutes  Assessment & Plan:   A:  1. Chronic irregular periods 2. US showed left ovarian cyst resolved  P:  1. Pt to follow-up in 6 weeks for review of menstrual history and consider beginning fertility work-up 2. Advised pt to use [email or web address]    By signing my name below, I, Gwenyth Oberatherine Macek, attest that this documentation has been prepared under the direction and in the presence of Tilda BurrowJohn Temara Lanum V, MD. Electronically Signed: Gwenyth Oberatherine Macek, ED Scribe. 10/30/2015. 11:34 AM. I personally performed the services described in this documentation, which was SCRIBED in my presence. The recorded information has been reviewed and considered accurate. It has been edited as necessary during review. Tilda BurrowFERGUSON,Dalicia Kisner V, MD

## 2015-10-30 NOTE — Progress Notes (Addendum)
PELVIC US TA/TV: normal homogenous anteverted uterus,EEC 2.43mm,resolved lt ov cyst,both ov's have the appearance of polycystic ovaries,diffusely enlarged ovaries w/mult small subcortical follicles w/ the string of pearls appearance,no free fluid,ov's appears to be mobile,no pain during ultrasound.

## 2015-10-30 NOTE — Patient Instructions (Signed)
Fertility WEBSITE:  [email or web address] KEEP temperature and menstrual period information, so as to learn when you ovulate.

## 2015-11-01 DIAGNOSIS — N83209 Unspecified ovarian cyst, unspecified side: Secondary | ICD-10-CM | POA: Insufficient documentation

## 2015-12-11 ENCOUNTER — Ambulatory Visit: Payer: Medicaid Other | Admitting: Obstetrics and Gynecology

## 2016-02-21 ENCOUNTER — Telehealth: Payer: Self-pay | Admitting: Obstetrics & Gynecology

## 2016-02-21 NOTE — Telephone Encounter (Signed)
Pt c/o vaginal irritation and redness, no discharge or odor. Pt given an appt for 02/25/2016.

## 2016-02-21 NOTE — Telephone Encounter (Signed)
Pt states that she is having an irritation on her vaginal area and would like to speak with a nurse. Please contact pt

## 2016-02-25 ENCOUNTER — Encounter: Payer: Self-pay | Admitting: *Deleted

## 2016-02-25 ENCOUNTER — Ambulatory Visit: Payer: Self-pay | Admitting: Adult Health

## 2016-10-13 ENCOUNTER — Emergency Department (HOSPITAL_COMMUNITY)
Admission: EM | Admit: 2016-10-13 | Discharge: 2016-10-13 | Disposition: A | Discharge status: Home / Self Care | Payer: Medicaid Other | Attending: Emergency Medicine | Admitting: Emergency Medicine

## 2016-10-13 ENCOUNTER — Emergency Department (HOSPITAL_COMMUNITY): Payer: Medicaid Other

## 2016-10-13 ENCOUNTER — Encounter (HOSPITAL_COMMUNITY): Payer: Self-pay | Admitting: Emergency Medicine

## 2016-10-13 DIAGNOSIS — R0981 Nasal congestion: Secondary | ICD-10-CM | POA: Diagnosis present

## 2016-10-13 DIAGNOSIS — F17210 Nicotine dependence, cigarettes, uncomplicated: Secondary | ICD-10-CM | POA: Diagnosis not present

## 2016-10-13 DIAGNOSIS — J020 Streptococcal pharyngitis: Secondary | ICD-10-CM

## 2016-10-13 LAB — RAPID STREP SCREEN (MED CTR MEBANE ONLY): STREPTOCOCCUS, GROUP A SCREEN (DIRECT): POSITIVE — AB

## 2016-10-13 LAB — POC URINE PREG, ED: Preg Test, Ur: NEGATIVE

## 2016-10-13 MED ORDER — AZITHROMYCIN 250 MG PO TABS
500.0000 mg | ORAL_TABLET | Freq: Once | ORAL | Status: AC
  Administered 2016-10-13: 500 mg via ORAL
  Filled 2016-10-13: qty 2

## 2016-10-13 MED ORDER — IBUPROFEN 800 MG PO TABS: 800.0000 mg | ORAL_TABLET | Freq: Three times a day (TID) | ORAL | 0 refills | Status: DC

## 2016-10-13 MED ORDER — AZITHROMYCIN 250 MG PO TABS: ORAL_TABLET | ORAL | 0 refills | Status: DC

## 2016-10-13 MED ORDER — IBUPROFEN 800 MG PO TABS
800.0000 mg | ORAL_TABLET | Freq: Once | ORAL | Status: AC
  Administered 2016-10-13: 800 mg via ORAL
  Filled 2016-10-13: qty 1

## 2016-10-13 NOTE — Discharge Instructions (Signed)
Rest,  Drink plenty of fluids.  Take motrin or tylenol for achiness and fever reduction.  Take your next dose of the zithromax tomorrow evening.

## 2016-10-13 NOTE — ED Triage Notes (Signed)
Pt reports productive cough with thick yellow sputum, sore throat and generalized body aches. Pt states she started with cold symptoms 2 weeks ago.

## 2016-10-13 NOTE — ED Notes (Signed)
Pt reports having cold symptoms for over a week. Complaining of body aches, sore throat, & chills.

## 2016-10-13 NOTE — ED Notes (Signed)
Pt alert & oriented x4, stable gait. Patient given discharge instructions, paperwork & prescription(s). Patient  instructed to stop at the registration desk to finish any additional paperwork. Patient verbalized understanding. Pt left department w/ no further questions. 

## 2016-10-16 NOTE — ED Provider Notes (Signed)
[facility] Provider Note   CSN: [id number] Arrival date & time: 10/13/16  1908     History   Chief Complaint Chief Complaint  Patient presents with  . flu like symptoms    HPI [name] is a 35 y.o. female presenting with a 2 week history of uri type symptoms which included nasal congestion with thick yellow sputum production, sore throat, low grade fever with body aches which was improving,but now has developed increased sore throat over the past several days.   Symptoms do not include shortness of breath, chest pain,  Nausea, vomiting or diarrhea.  The patient has taken an otc generic dayquill product prior to arrival with no significant improvement in symptoms. .  The history is provided by the patient.    History reviewed. No pertinent past medical history.  Patient Active Problem List   Diagnosis Date Noted  . Ovarian cyst 11/01/2015    History reviewed. No pertinent surgical history.  OB History    Gravida Para Term Preterm AB Living   1 1   1        SAB TAB Ectopic Multiple Live Births                   Home Medications    Prior to Admission medications   Medication Sig Start Date End Date Taking? Authorizing Provider  Pseudoeph-Doxylamine-DM-APAP 30-6.25-15-325 MG CAPS Take 2 capsules by mouth daily as needed (for flu symptoms).   Yes Historical Provider, MD  azithromycin (ZITHROMAX Z-PAK) 250 MG tablet Take one tablet daily for 4 days. 10/13/16   Burgess AmorJulie Lester Crickenberger, PA-C  ibuprofen (ADVIL,MOTRIN) 800 MG tablet Take 1 tablet (800 mg total) by mouth 3 (three) times daily. 10/13/16   Burgess AmorJulie Traeson Dusza, PA-C    Family History Family History  Problem Relation Age of Onset  . Diabetes Paternal Grandfather   . Diabetes Maternal Grandmother   . Hypertension Father   . Asthma Son     Social History Social History  Substance Use Topics  . Smoking status: Current Every Day Smoker    Packs/day: 0.25    Years: 15.00    Types: Cigarettes  . Smokeless tobacco:  Never Used  . Alcohol use 0.0 oz/week     Comment: occas     Allergies   Penicillins   Review of Systems Review of Systems  Constitutional: Positive for chills and fever.  HENT: Positive for congestion, rhinorrhea and sore throat. Negative for ear pain, sinus pressure, trouble swallowing and voice change.   Eyes: Negative for discharge.  Respiratory: Positive for cough. Negative for shortness of breath, wheezing and stridor.   Cardiovascular: Negative for chest pain.  Gastrointestinal: Negative for abdominal pain, diarrhea, nausea and vomiting.  Genitourinary: Negative.   Musculoskeletal: Positive for myalgias.     Physical Exam Updated Vital Signs BP 125/72   Pulse 98   Temp 98.4 F (36.9 C) (Oral)   Resp 18   Ht 5\' 4"  (1.626 m)   Wt 106.6 kg   LMP 09/02/2016   SpO2 99%   BMI 40.34 kg/m   Physical Exam  Constitutional: She is oriented to person, place, and time. She appears well-developed and well-nourished.  HENT:  Head: Normocephalic and atraumatic.  Right Ear: Tympanic membrane and ear canal normal.  Left Ear: Tympanic membrane and ear canal normal.  Nose: Rhinorrhea present. No mucosal edema.  Mouth/Throat: Uvula is midline and mucous membranes are normal. Posterior oropharyngeal erythema present. No oropharyngeal exudate, posterior oropharyngeal edema  or tonsillar abscesses. Tonsils are 2+ on the right. Tonsils are 2+ on the left. Tonsillar exudate.  Eyes: Conjunctivae are normal.  Cardiovascular: Normal rate and normal heart sounds.   Pulmonary/Chest: Effort normal. No respiratory distress. She has no wheezes. She has no rales.  Abdominal: Soft. There is no tenderness.  Musculoskeletal: Normal range of motion.  Neurological: She is alert and oriented to person, place, and time.  Skin: Skin is warm and dry. No rash noted.  Psychiatric: She has a normal mood and affect.     ED Treatments / Results  Labs (all labs ordered are listed, but only abnormal  results are displayed) Labs Reviewed  RAPID STREP SCREEN (NOT AT [facility]) - Abnormal; Notable for the following:       Result Value   Streptococcus, Group A Screen (Direct) POSITIVE (*)    All other components within normal limits  POC URINE PREG, ED    EKG  EKG Interpretation None       Radiology No results found.  Procedures Procedures (including critical care time)  Medications Ordered in ED Medications  ibuprofen (ADVIL,MOTRIN) tablet 800 mg (800 mg Oral Given 10/13/16 2154)  azithromycin (ZITHROMAX) tablet 500 mg (500 mg Oral Given 10/13/16 2247)     Initial Impression / Assessment and Plan / ED Course  I have reviewed the triage vital signs and the nursing notes.  Pertinent labs & imaging results that were available during my care of the patient were reviewed by me and considered in my medical decision making (see chart for details).  Clinical Course     The patient appears reasonably screened and/or stabilized for discharge and I doubt any other medical condition or other [facility] requiring further screening, evaluation, or treatment in the ED at this time prior to discharge.    Final Clinical Impressions(s) / ED Diagnoses   Final diagnoses:  Strep pharyngitis    New Prescriptions Discharge Medication List as of 10/13/2016 10:37 PM    START taking these medications   Details  azithromycin (ZITHROMAX Z-PAK) 250 MG tablet Take one tablet daily for 4 days., Print    ibuprofen (ADVIL,MOTRIN) 800 MG tablet Take 1 tablet (800 mg total) by mouth 3 (three) times daily., Starting Tue 10/13/2016, Print         Burgess AmorJulie Deshara Rossi, PA-C 10/16/16 1057    Margarita Grizzleanielle Ray, MD 10/19/16 [phone]

## 2017-04-20 ENCOUNTER — Other Ambulatory Visit (HOSPITAL_COMMUNITY)
Admission: RE | Admit: 2017-04-20 | Discharge: 2017-04-20 | Disposition: A | Discharge status: Home / Self Care | Payer: Medicaid Other | Source: Ambulatory Visit | Attending: Advanced Practice Midwife | Admitting: Advanced Practice Midwife

## 2017-04-20 ENCOUNTER — Ambulatory Visit (INDEPENDENT_AMBULATORY_CARE_PROVIDER_SITE_OTHER): Payer: No Typology Code available for payment source | Admitting: Advanced Practice Midwife

## 2017-04-20 ENCOUNTER — Encounter: Payer: Self-pay | Admitting: Advanced Practice Midwife

## 2017-04-20 VITALS — BP 128/80 | HR 85 | Ht 65.0 in | Wt 240.0 lb

## 2017-04-20 DIAGNOSIS — Z3009 Encounter for other general counseling and advice on contraception: Secondary | ICD-10-CM | POA: Insufficient documentation

## 2017-04-20 DIAGNOSIS — Z01419 Encounter for gynecological examination (general) (routine) without abnormal findings: Secondary | ICD-10-CM | POA: Insufficient documentation

## 2017-04-20 NOTE — Progress Notes (Signed)
Brittney Caldwell 36 y.o.  Vitals:   04/20/17 1030  BP: 128/80  Pulse: 85     Filed Weights   04/20/17 1030  Weight: 240 lb (108.9 kg)    Past Medical History: History reviewed. No pertinent past medical history.  Past Surgical History: History reviewed. No pertinent surgical history.  Family History: Family History  Problem Relation Age of Onset  . Diabetes Paternal Grandfather   . Diabetes Maternal Grandmother   . Hypertension Father   . Hyperlipidemia Father   . Asthma Son   . Hypertension Mother   . Hyperlipidemia Mother     Social History: Social History  Substance Use Topics  . Smoking status: Current Every Day Smoker    Packs/day: 0.25    Years: 15.00    Types: Cigarettes  . Smokeless tobacco: Never Used  . Alcohol use 0.0 oz/week     Comment: occas    Allergies:  Allergies  Allergen Reactions  . Penicillins Hives    Has patient had a PCN reaction causing immediate rash, facial/tongue/throat swelling, SOB or lightheadedness with hypotension: Yes Has patient had a PCN reaction causing severe rash involving mucus membranes or skin necrosis: No Has patient had a PCN reaction that required hospitalization No Has patient had a PCN reaction occurring within the last 10 years: No If all of the above answers are "NO", then may proceed with Cephalosporin use.      No current outpatient prescriptions on file.  History of Present Illness: Here for family planning pap/physical. Requests pap (last one 2016, normal).  Off BC for 7 years, doesn't have sex "all that often", but would like to be pregnant. She has a 35 yo and he has a 35 yo.  Just started using period tracker app 2 months ago, but periods are usually "regular".     Review of Systems   Patient denies any headaches, blurred vision, shortness of breath, chest pain, abdominal pain, problems with bowel movements, urination, or intercourse.   Physical Exam: General:  Well developed, well nourished, no  acute distress Skin:  Warm and dry Neck:  Midline trachea, normal thyroid Lungs; Clear to auscultation bilaterally Breast:  No dominant palpable mass, retraction, or nipple discharge Cardiovascular: Regular rate and rhythm Abdomen:  Soft, non tender, no hepatosplenomegaly Pelvic:  External genitalia is normal in appearance.  The vagina is normal in appearance.  The cervix is bulbous.  Uterus is felt to be normal size, shape, and contour.  No adnexal masses or tenderness noted.  Extremities:  No swelling or varicosities noted Psych:  No mood changes.     Impression: Normal GYN exam Pregnancy desired     Plan: Use ovulation predictor sticks and if not pregnant in 6 m months, will check semen and consider clomid Start PNV

## 2017-04-22 LAB — CYTOLOGY - PAP
ADEQUACY: ABSENT
Chlamydia: NEGATIVE
Diagnosis: NEGATIVE
HPV: NOT DETECTED
NEISSERIA GONORRHEA: NEGATIVE

## 2017-08-12 ENCOUNTER — Encounter (HOSPITAL_COMMUNITY): Payer: Self-pay | Admitting: *Deleted

## 2017-08-12 ENCOUNTER — Emergency Department (HOSPITAL_COMMUNITY)
Admission: EM | Admit: 2017-08-12 | Discharge: 2017-08-12 | Disposition: A | Discharge status: Home / Self Care | Payer: No Typology Code available for payment source | Attending: Emergency Medicine | Admitting: Emergency Medicine

## 2017-08-12 DIAGNOSIS — F17210 Nicotine dependence, cigarettes, uncomplicated: Secondary | ICD-10-CM | POA: Diagnosis not present

## 2017-08-12 DIAGNOSIS — K0889 Other specified disorders of teeth and supporting structures: Secondary | ICD-10-CM | POA: Diagnosis present

## 2017-08-12 DIAGNOSIS — K029 Dental caries, unspecified: Secondary | ICD-10-CM | POA: Diagnosis not present

## 2017-08-12 DIAGNOSIS — K047 Periapical abscess without sinus: Secondary | ICD-10-CM | POA: Insufficient documentation

## 2017-08-12 MED ORDER — HYDROCODONE-ACETAMINOPHEN 5-325 MG PO TABS
1.0000 | ORAL_TABLET | Freq: Once | ORAL | Status: AC
  Administered 2017-08-12: 1 via ORAL
  Filled 2017-08-12: qty 1

## 2017-08-12 MED ORDER — CLINDAMYCIN HCL 300 MG PO CAPS: 300.0000 mg | ORAL_CAPSULE | Freq: Three times a day (TID) | ORAL | 0 refills | Status: DC

## 2017-08-12 MED ORDER — HYDROCODONE-ACETAMINOPHEN 5-325 MG PO TABS: 1.0000 | ORAL_TABLET | ORAL | 0 refills | Status: DC | PRN

## 2017-08-12 MED ORDER — CLINDAMYCIN HCL 150 MG PO CAPS
300.0000 mg | ORAL_CAPSULE | Freq: Once | ORAL | Status: AC
  Administered 2017-08-12: 300 mg via ORAL
  Filled 2017-08-12: qty 2

## 2017-08-12 NOTE — ED Provider Notes (Signed)
[facility]-EMERGENCY DEPT Provider Note   CSN: [id number] Arrival date & time: 08/12/17  1609     History   Chief Complaint Chief Complaint  Patient presents with  . Dental Pain    HPI Brittney Caldwell is a 36 y.o. female presenting with a 1 day history of dental pain and gingival swelling.   The patient has a history of decay in the tooth involved which but never caused pain until this am, waking at 3 am with intense dental pain.  There has been no fevers, chills, nausea or vomiting, also no complaint of difficulty swallowing, although chewing makes pain worse.  The patient has tried motrin 400 mg and a generic otc generic teething liquid without relief of symptoms.  She does not have a dentist.  .  The history is provided by the patient and the spouse.    History reviewed. No pertinent past medical history.  There are no active problems to display for this patient.   History reviewed. No pertinent surgical history.  OB History    Gravida Para Term Preterm AB Living   SAB TAB Ectopic Multiple Live Births                   Home Medications    Prior to Admission medications   Medication Sig Start Date End Date Taking? Authorizing Provider  clindamycin (CLEOCIN) 300 MG capsule Take 1 capsule (300 mg total) by mouth 3 (three) times daily. 08/12/17   Burgess Amor, PA-C  HYDROcodone-acetaminophen (NORCO/VICODIN) 5-325 MG tablet Take 1 tablet by mouth every 4 (four) hours as needed. 08/12/17   Burgess Amor, PA-C    Family History Family History  Problem Relation Age of Onset  . Diabetes Paternal Grandfather   . Diabetes Maternal Grandmother   . Hypertension Father   . Hyperlipidemia Father   . Asthma Son   . Hypertension Mother   . Hyperlipidemia Mother     Social History Social History  Substance Use Topics  . Smoking status: Current Every Day Smoker    Packs/day: 0.25    Years: 15.00    Types: Cigarettes  . Smokeless tobacco: Never Used  . Alcohol use  0.0 oz/week     Comment: occas     Allergies   Penicillins   Review of Systems Review of Systems  Constitutional: Negative for fever.  HENT: Positive for dental problem. Negative for facial swelling and sore throat.   Respiratory: Negative for shortness of breath.   Musculoskeletal: Negative for neck pain and neck stiffness.     Physical Exam Updated Vital Signs BP (!) 163/100 (BP Location: Right Arm)   Pulse 70   Temp (!) 97.4 F (36.3 C) (Oral)   Resp 20   Ht 5' 4.5" (1.638 m)   Wt 108.9 kg (240 lb)   LMP 07/22/2017   SpO2 100%   BMI 40.56 kg/m   Physical Exam  Constitutional: She is oriented to person, place, and time. She appears well-developed and well-nourished. No distress.  HENT:  Head: Normocephalic and atraumatic.  Right Ear: Tympanic membrane and external ear normal.  Left Ear: Tympanic membrane and external ear normal.  Mouth/Throat: Oropharynx is clear and moist and mucous membranes are normal. No oral lesions. No trismus in the jaw. Dental caries present. No dental abscesses.    Eyes: Conjunctivae are normal.  Neck: Normal range of motion. Neck supple.  Cardiovascular: Normal rate.  Pulmonary/Chest: Effort normal.  Musculoskeletal: Normal range of motion.  Lymphadenopathy:    She has no cervical adenopathy.  Neurological: She is alert and oriented to person, place, and time.  Skin: Skin is warm and dry. No erythema.  Psychiatric: Her mood appears anxious.     ED Treatments / Results  Labs (all labs ordered are listed, but only abnormal results are displayed) Labs Reviewed - No data to display  EKG  EKG Interpretation None       Radiology No results found.  Procedures Procedures (including critical care time)  Medications Ordered in ED Medications  clindamycin (CLEOCIN) capsule 300 mg (300 mg Oral Given 08/12/17 1702)  HYDROcodone-acetaminophen (NORCO/VICODIN) 5-325 MG per tablet 1 tablet (1 tablet Oral Given 08/12/17 1702)      Initial Impression / Assessment and Plan / ED Course  I have reviewed the triage vital signs and the nursing notes.  Pertinent labs & imaging results that were available during my care of the patient were reviewed by me and considered in my medical decision making (see chart for details).     No trismus or drainable abscess, no mouth, tongue or facial edema.  Clindamycin, hydrocodone for first 1-2 days, continue motrin. Dental referrals given.  Final Clinical Impressions(s) / ED Diagnoses   Final diagnoses:  Infected dental caries    New Prescriptions New Prescriptions   CLINDAMYCIN (CLEOCIN) 300 MG CAPSULE    Take 1 capsule (300 mg total) by mouth 3 (three) times daily.   HYDROCODONE-ACETAMINOPHEN (NORCO/VICODIN) 5-325 MG TABLET    Take 1 tablet by mouth every 4 (four) hours as needed.     Burgess Amor, PA-C 08/12/17 1714    Raeford Razor, MD 08/13/17 1239

## 2017-08-12 NOTE — ED Triage Notes (Signed)
Right side dental pain

## 2017-08-12 NOTE — Discharge Instructions (Signed)
I suspect you have an infection starting deep within the dental cavity causing your pain.  Complete your entire course of antibiotics as prescribed.  You  may use the hydrocodone for pain relief but do not drive within 4 hours of taking as this will make you drowsy.  Avoid applying heat or ice to this area which can worsen your symptoms.  You may use half peroxide and water swish and spit after meals followed by rinsing with warm water to keep this area clean as discussed.  Refer to the dental list for finding a dentist for further management of your symptoms. Continue taking motrin (800 mg or 4 tablets every 8 hours is the maximum safe dosing).

## 2017-08-12 NOTE — ED Notes (Signed)
Pt alert & oriented x4, stable gait. Patient given discharge instructions, paperwork & prescription(s). Patient informed not to drive, operate any equipment & handel any important documents 4 hours after taking pain medication. Patient  instructed to stop at the registration desk to finish any additional paperwork. Patient  verbalized understanding. Pt left department w/ no further questions. 

## 2018-02-04 ENCOUNTER — Other Ambulatory Visit: Payer: Self-pay

## 2018-02-04 ENCOUNTER — Emergency Department (HOSPITAL_COMMUNITY)
Admission: EM | Admit: 2018-02-04 | Discharge: 2018-02-04 | Disposition: A | Discharge status: Home / Self Care | Payer: 59 | Attending: Emergency Medicine | Admitting: Emergency Medicine

## 2018-02-04 ENCOUNTER — Emergency Department (HOSPITAL_COMMUNITY): Payer: 59

## 2018-02-04 ENCOUNTER — Encounter (HOSPITAL_COMMUNITY): Payer: Self-pay | Admitting: Emergency Medicine

## 2018-02-04 DIAGNOSIS — R197 Diarrhea, unspecified: Secondary | ICD-10-CM

## 2018-02-04 DIAGNOSIS — F17210 Nicotine dependence, cigarettes, uncomplicated: Secondary | ICD-10-CM | POA: Insufficient documentation

## 2018-02-04 DIAGNOSIS — R55 Syncope and collapse: Secondary | ICD-10-CM | POA: Diagnosis present

## 2018-02-04 DIAGNOSIS — R110 Nausea: Secondary | ICD-10-CM

## 2018-02-04 LAB — URINALYSIS, ROUTINE W REFLEX MICROSCOPIC
BACTERIA UA: NONE SEEN
Bilirubin Urine: NEGATIVE
GLUCOSE, UA: 50 mg/dL — AB
Ketones, ur: NEGATIVE mg/dL
Leukocytes, UA: NEGATIVE
Nitrite: NEGATIVE
PROTEIN: NEGATIVE mg/dL
Specific Gravity, Urine: 1.019 (ref 1.005–1.030)
pH: 5 (ref 5.0–8.0)

## 2018-02-04 LAB — I-STAT BETA HCG BLOOD, ED (MC, WL, AP ONLY): I-stat hCG, quantitative: <5 m[IU]/mL (ref <5)

## 2018-02-04 LAB — COMPREHENSIVE METABOLIC PANEL
ALK PHOS: 71 U/L (ref 38–126)
ALT: 16 U/L (ref 14–54)
ANION GAP: 10 (ref 5–15)
AST: 18 U/L (ref 15–41)
Albumin: 3.6 g/dL (ref 3.5–5.0)
BUN: 8 mg/dL (ref 6–20)
CALCIUM: 8.9 mg/dL (ref 8.9–10.3)
CHLORIDE: 102 mmol/L (ref 101–111)
CO2: 22 mmol/L (ref 22–32)
CREATININE: 0.71 mg/dL (ref 0.44–1.00)
Glucose, Bld: 155 mg/dL — ABNORMAL HIGH (ref 65–99)
Potassium: 4 mmol/L (ref 3.5–5.1)
Sodium: 134 mmol/L — ABNORMAL LOW (ref 135–145)
Total Bilirubin: 0.5 mg/dL (ref 0.3–1.2)
Total Protein: 7.1 g/dL (ref 6.5–8.1)

## 2018-02-04 LAB — CBC
HEMATOCRIT: 42.4 % (ref 36.0–46.0)
HEMOGLOBIN: 14 g/dL (ref 12.0–15.0)
MCH: 29.5 pg (ref 26.0–34.0)
MCHC: 33 g/dL (ref 30.0–36.0)
MCV: 89.5 fL (ref 78.0–100.0)
Platelets: 210 10*3/uL (ref 150–400)
RBC: 4.74 MIL/uL (ref 3.87–5.11)
RDW: 13.4 % (ref 11.5–15.5)
WBC: 8.5 10*3/uL (ref 4.0–10.5)

## 2018-02-04 LAB — DIFFERENTIAL
BASOS ABS: 0 10*3/uL (ref 0.0–0.1)
Basophils Relative: 0 %
EOS ABS: 0.1 10*3/uL (ref 0.0–0.7)
EOS PCT: 1 %
Lymphocytes Relative: 34 %
Lymphs Abs: 2.8 10*3/uL (ref 0.7–4.0)
Monocytes Absolute: 0.4 10*3/uL (ref 0.1–1.0)
Monocytes Relative: 5 %
NEUTROS PCT: 60 %
Neutro Abs: 5.1 10*3/uL (ref 1.7–7.7)

## 2018-02-04 LAB — TROPONIN I

## 2018-02-04 LAB — LIPASE, BLOOD: LIPASE: 46 U/L (ref 11–51)

## 2018-02-04 LAB — CBG MONITORING, ED: Glucose-Capillary: 168 mg/dL — ABNORMAL HIGH (ref 65–99)

## 2018-02-04 MED ORDER — ONDANSETRON 4 MG PO TBDP: 4.0000 mg | ORAL_TABLET | Freq: Three times a day (TID) | ORAL | 0 refills | Status: DC | PRN

## 2018-02-04 MED ORDER — ONDANSETRON HCL 4 MG/2ML IJ SOLN
4.0000 mg | INTRAMUSCULAR | Status: DC | PRN
  Administered 2018-02-04: 4 mg via INTRAVENOUS
  Filled 2018-02-04: qty 2

## 2018-02-04 MED ORDER — SODIUM CHLORIDE 0.9 % IV BOLUS (SEPSIS)
1000.0000 mL | Freq: Once | INTRAVENOUS | Status: AC
  Administered 2018-02-04: 1000 mL via INTRAVENOUS

## 2018-02-04 NOTE — Discharge Instructions (Addendum)
Take the prescription as directed.  Increase your fluid intake (ie:  Gatoraide) for the next few days.  Eat a bland diet and advance to your regular diet slowly as you can tolerate it.   Avoid full strength juices, as well as milk and milk products until your diarrhea has resolved.   Call your regular medical doctor on Monday to schedule a follow up appointment next week.  Return to the Emergency Department immediately sooner if worsening.

## 2018-02-04 NOTE — ED Provider Notes (Signed)
[facility] EMERGENCY DEPARTMENT Provider Note   CSN: [id number] Arrival date & time: 02/04/18  1230     History   Chief Complaint Chief Complaint  Patient presents with  . Near Syncope    HPI Brittney Caldwell is a 37 y.o. female.  HPI Pt was seen at 1420. Per pt, c/o gradual onset and persistence of constant nausea, cough, and multiple intermittent episodes of diarrhea and "chills/sweats" since yesterday. Has been associated with feeling "lightheaded" for the past several weeks. Pt states last night she was standing doing something and "then must have blacked out for a minute because I came back and didn't know what I was doing." Denies falling. Denies CP/palpitations, no SOB, no abd pain, no vomiting, no black or blood in stools, no focal motor weakness, no tingling/numbness in extremities.     History reviewed. No pertinent past medical history.  There are no active problems to display for this patient.   History reviewed. No pertinent surgical history.   OB History    Gravida  1   Para  1   Term      Preterm  1   AB      Living        SAB      TAB      Ectopic      Multiple      Live Births               Home Medications    Prior to Admission medications   Medication Sig Start Date End Date Taking? Authorizing Provider  ibuprofen (ADVIL,MOTRIN) 200 MG tablet Take 400 mg by mouth every 6 (six) hours as needed.   Yes [provider]    Family History Family History  Problem Relation Age of Onset  . Diabetes Paternal Grandfather   . Diabetes Maternal Grandmother   . Hypertension Father   . Hyperlipidemia Father   . Asthma Son   . Hypertension Mother   . Hyperlipidemia Mother     Social History Social History   Tobacco Use  . Smoking status: Current Every Day Smoker    Packs/day: 0.25    Years: 15.00    Pack years: 3.75    Types: Cigarettes  . Smokeless tobacco: Never Used  Substance Use Topics  . Alcohol use: Yes   Alcohol/week: 0.0 oz    Comment: occas  . Drug use: Yes    Types: Marijuana    Comment: 2 weeks ago     Allergies   Penicillins   Review of Systems Review of Systems ROS: Statement: All systems negative except as marked or noted in the HPI; Constitutional: Negative for fever and +sweats/chills. ; ; Eyes: Negative for eye pain, redness and discharge. ; ; ENMT: Negative for ear pain, hoarseness, nasal congestion, sinus pressure and sore throat. ; ; Cardiovascular: Negative for chest pain, palpitations, diaphoresis, dyspnea and peripheral edema. ; ; Respiratory: +cough. Negative for wheezing and stridor. ; ; Gastrointestinal: +nausea, diarrhea. Negative for vomiting, abdominal pain, blood in stool, hematemesis, jaundice and rectal bleeding. . ; ; Genitourinary: Negative for dysuria, flank pain and hematuria. ; ; Musculoskeletal: Negative for back pain and neck pain. Negative for swelling and trauma.; ; Skin: Negative for pruritus, rash, abrasions, blisters, bruising and skin lesion.; ; Neuro: +lightheadedness, possible syncope. Negative for headache and neck stiffness. Negative for weakness, extremity weakness, paresthesias, involuntary movement, seizure.       Physical Exam Updated Vital Signs BP 132/85  Pulse 76   Temp 98.8 F (37.1 C) (Oral)   Resp 15   Ht 5\' 4"  (1.626 m)   Wt 108.9 kg (240 lb)   LMP 01/10/2018   SpO2 100%   BMI 41.20 kg/m     16:21 Orthostatic Vital Signs LA  Orthostatic Lying   BP- Lying: 131/79   Pulse- Lying: 65       Orthostatic Sitting  BP- Sitting: 147/91Abnormal    Pulse- Sitting: 74       Orthostatic Standing at 0 minutes  BP- Standing at 0 minutes: 137/88   Pulse- Standing at 0 minutes: 88     Physical Exam 1425: Physical examination:  Nursing notes reviewed; Vital signs and O2 SAT reviewed;  Constitutional: Well developed, Well nourished, Well hydrated, In no acute distress; Head:  Normocephalic, atraumatic; Eyes: EOMI, PERRL, No  scleral icterus; ENMT: Mouth and pharynx normal, Mucous membranes moist; Neck: Supple, Full range of motion, No lymphadenopathy. No meningeal signs.; Cardiovascular: Regular rate and rhythm, No gallop; Respiratory: Breath sounds clear & equal bilaterally, No wheezes.  Speaking full sentences with ease, Normal respiratory effort/excursion; Chest: Nontender, Movement normal; Abdomen: Soft, Nontender, Nondistended, Normal bowel sounds; Genitourinary: No CVA tenderness; Extremities: Peripheral pulses normal, No tenderness, No edema, No calf edema or asymmetry.; Neuro: AA&Ox3, Major CN grossly intact. Speech clear.  No facial droop.  No nystagmus. Grips equal. Strength 5/5 equal bilat UE's and LE's.  DTR 2/4 equal bilat UE's and LE's.  No gross sensory deficits.  Normal cerebellar testing bilat UE's (finger-nose) and LE's (heel-shin).; Skin: Color normal, Warm, Dry.   ED Treatments / Results  Labs (all labs ordered are listed, but only abnormal results are displayed)   EKG EKG Interpretation  Date/Time:  Friday February 04 2018 12:49:00 EDT Ventricular Rate:  78 PR Interval:  134 QRS Duration: 78 QT Interval:  390 QTC Calculation: 444 R Axis:   24 Text Interpretation:  Normal sinus rhythm Normal ECG No old tracing to compare Confirmed by Samuel Jester [phone]) on 02/04/2018 2:25:24 PM   Radiology   Procedures Procedures (including critical care time)  Medications Ordered in ED Medications  ondansetron (ZOFRAN) injection 4 mg (4 mg Intravenous Given 02/04/18 1438)  sodium chloride 0.9 % bolus 1,000 mL (1,000 mLs Intravenous New Bag/Given 02/04/18 1437)     Initial Impression / Assessment and Plan / ED Course  I have reviewed the triage vital signs and the nursing notes.  Pertinent labs & imaging results that were available during my care of the patient were reviewed by me and considered in my medical decision making (see chart for details).  MDM Reviewed: previous chart, nursing note  and vitals Reviewed previous: labs Interpretation: labs, ECG, x-ray and CT scan    Results for orders placed or performed during the hospital encounter of 02/04/18  CBC  Result Value Ref Range   WBC 8.5 4.0 - 10.5 K/uL   RBC 4.74 3.87 - 5.11 MIL/uL   Hemoglobin 14.0 12.0 - 15.0 g/dL   HCT 60.4 54.0 - 98.1 %   MCV 89.5 78.0 - 100.0 fL   MCH 29.5 26.0 - 34.0 pg   MCHC 33.0 30.0 - 36.0 g/dL   RDW 19.1 47.8 - 29.5 %   Platelets 210 150 - 400 K/uL  Urinalysis, Routine w reflex microscopic  Result Value Ref Range   Color, Urine YELLOW YELLOW   APPearance HAZY (A) CLEAR   Specific Gravity, Urine 1.019 1.005 - 1.030   pH 5.0  5.0 - 8.0   Glucose, UA 50 (A) NEGATIVE mg/dL   Hgb urine dipstick SMALL (A) NEGATIVE   Bilirubin Urine NEGATIVE NEGATIVE   Ketones, ur NEGATIVE NEGATIVE mg/dL   Protein, ur NEGATIVE NEGATIVE mg/dL   Nitrite NEGATIVE NEGATIVE   Leukocytes, UA NEGATIVE NEGATIVE   RBC / HPF 6-30 0 - 5 RBC/hpf   WBC, UA 0-5 0 - 5 WBC/hpf   Bacteria, UA NONE SEEN NONE SEEN   Squamous Epithelial / LPF 6-30 (A) NONE SEEN   Mucus PRESENT   Comprehensive metabolic panel  Result Value Ref Range   Sodium 134 (L) 135 - 145 mmol/L   Potassium 4.0 3.5 - 5.1 mmol/L   Chloride 102 101 - 111 mmol/L   CO2 22 22 - 32 mmol/L   Glucose, Bld 155 (H) 65 - 99 mg/dL   BUN 8 6 - 20 mg/dL   Creatinine, Ser 1.61 0.44 - 1.00 mg/dL   Calcium 8.9 8.9 - 09.6 mg/dL   Total Protein 7.1 6.5 - 8.1 g/dL   Albumin 3.6 3.5 - 5.0 g/dL   AST 18 15 - 41 U/L   ALT 16 14 - 54 U/L   Alkaline Phosphatase 71 38 - 126 U/L   Total Bilirubin 0.5 0.3 - 1.2 mg/dL   GFR calc non Af Amer >60 >60 mL/min   GFR calc Af Amer >60 >60 mL/min   Anion gap 10 5 - 15  Lipase, blood  Result Value Ref Range   Lipase 46 11 - 51 U/L  Differential  Result Value Ref Range   Neutrophils Relative % 60 %   Neutro Abs 5.1 1.7 - 7.7 K/uL   Lymphocytes Relative 34 %   Lymphs Abs 2.8 0.7 - 4.0 K/uL   Monocytes Relative 5 %    Monocytes Absolute 0.4 0.1 - 1.0 K/uL   Eosinophils Relative 1 %   Eosinophils Absolute 0.1 0.0 - 0.7 K/uL   Basophils Relative 0 %   Basophils Absolute 0.0 0.0 - 0.1 K/uL  Troponin I  Result Value Ref Range   Troponin I <0.03 <0.03 ng/mL  CBG monitoring, ED  Result Value Ref Range   Glucose-Capillary 168 (H) 65 - 99 mg/dL  I-Stat beta hCG blood, ED  Result Value Ref Range   I-stat hCG, quantitative <5.0 <5 mIU/mL   Comment 3           Ct Head Wo Contrast Result Date: 02/04/2018 CLINICAL DATA:  Intermittent dizziness. EXAM: CT HEAD WITHOUT CONTRAST TECHNIQUE: Contiguous axial images were obtained from the base of the skull through the vertex without intravenous contrast. COMPARISON:  None. FINDINGS: Brain: No evidence of acute infarction, hemorrhage, hydrocephalus, extra-axial collection or mass lesion/mass effect. Vascular: No hyperdense vessel or unexpected calcification. Skull: Normal. Negative for fracture or focal lesion. Sinuses/Orbits: No acute finding. Other: None. IMPRESSION: 1. Normal noncontrast head CT. Electronically Signed   By: Obie Dredge M.D.   On: 02/04/2018 15:01   Dg Abd Acute W/chest Result Date: 02/04/2018 CLINICAL DATA:  NAUSEA, DIARRHEA, PER ER NOTE, Patient c/o intermittent dizziness that started last night . Patient reports chills/sweats and nausea and vomiting. Patient states "I been feel bad and coughing for last couple of days but the dizziness started last night." Patient believes she may have had a syncopal episode last night. Per patient "I was standing there doing something and it was like I blacked out because I came back to and didn't know what I was doing." Denies fallingU-PREG WAS  NEGATIVE EXAM: DG ABDOMEN ACUTE W/ 1V CHEST COMPARISON:  10/13/2016 and previous FINDINGS: Heart size and mediastinal contours are within normal limits. Lungs are clear. No effusion. No free air. Normal bowel gas pattern. There are no abnormal calcifications. Regional bones  unremarkable. IMPRESSION: No acute cardiopulmonary disease. Negative abdominal radiographs. Electronically Signed   By: Corlis Leak  Hassell M.D.   On: 02/04/2018 15:12     1630:  Workup reassuring. Pt has tol PO well while in the ED without N/V.  No stooling while in the ED.  Abd remains benign, VSS. Feels better after IVF and wants to go home now. Tx symptomatically at this time. Dx and testing d/w pt.  Questions answered.  Verb understanding, agreeable to d/c home with outpt f/u.    Final Clinical Impressions(s) / ED Diagnoses   Final diagnoses:  None    ED Discharge Orders    None       Samuel JesterMcManus, Zelena Bushong, DO 02/08/18 1519

## 2018-02-04 NOTE — ED Triage Notes (Signed)
Patient c/o intermittent dizziness that started last night . Patient reports chills/sweats and nausea and vomiting. Patient states "I been feel bad and coughing for last couple of days but the dizziness started last night." Patient believes she may have had a syncopal episode last night. Per patient "I was standing there doing something and it was like I blacked out because I came back to and didn't know what I was doing." Denies falling.

## 2018-02-12 ENCOUNTER — Emergency Department (HOSPITAL_COMMUNITY)
Admission: EM | Admit: 2018-02-12 | Discharge: 2018-02-12 | Disposition: A | Discharge status: Home / Self Care | Payer: 59 | Attending: Emergency Medicine | Admitting: Emergency Medicine

## 2018-02-12 ENCOUNTER — Other Ambulatory Visit: Payer: Self-pay

## 2018-02-12 ENCOUNTER — Encounter (HOSPITAL_COMMUNITY): Payer: Self-pay | Admitting: *Deleted

## 2018-02-12 DIAGNOSIS — K0889 Other specified disorders of teeth and supporting structures: Secondary | ICD-10-CM

## 2018-02-12 DIAGNOSIS — F17210 Nicotine dependence, cigarettes, uncomplicated: Secondary | ICD-10-CM | POA: Diagnosis not present

## 2018-02-12 DIAGNOSIS — Z79899 Other long term (current) drug therapy: Secondary | ICD-10-CM | POA: Diagnosis not present

## 2018-02-12 MED ORDER — CLINDAMYCIN HCL 150 MG PO CAPS
300.0000 mg | ORAL_CAPSULE | Freq: Once | ORAL | Status: AC
  Administered 2018-02-12: 300 mg via ORAL
  Filled 2018-02-12: qty 2

## 2018-02-12 MED ORDER — CLINDAMYCIN HCL 300 MG PO CAPS: 300.0000 mg | ORAL_CAPSULE | Freq: Three times a day (TID) | ORAL | 0 refills | Status: DC

## 2018-02-12 MED ORDER — IBUPROFEN 400 MG PO TABS
400.0000 mg | ORAL_TABLET | Freq: Once | ORAL | Status: AC
  Administered 2018-02-12: 400 mg via ORAL
  Filled 2018-02-12: qty 1

## 2018-02-12 MED ORDER — HYDROCODONE-ACETAMINOPHEN 5-325 MG PO TABS
1.0000 | ORAL_TABLET | Freq: Once | ORAL | Status: AC
  Administered 2018-02-12: 1 via ORAL
  Filled 2018-02-12: qty 1

## 2018-02-12 NOTE — ED Provider Notes (Signed)
[facility] EMERGENCY DEPARTMENT Provider Note   CSN: [id number] Arrival date & time: 02/12/18  0054     History   Chief Complaint Chief Complaint  Patient presents with  . Dental Pain    HPI Brittney Caldwell is a 37 y.o. female.  The history is provided by the patient.  Dental Pain   This is a new problem. The current episode started yesterday. The problem occurs constantly. The problem has been gradually worsening. The pain is moderate. Treatments tried: ibuprofen. The treatment provided no relief.   Patient reports left upper dental pain for the past day.  No fevers or vomiting.  She reports it feels swollen.  No visual changes.   PMH-none OB History    Gravida  1   Para  1   Term      Preterm  1   AB      Living        SAB      TAB      Ectopic      Multiple      Live Births               Home Medications    Prior to Admission medications   Medication Sig Start Date End Date Taking? Authorizing Provider  clindamycin (CLEOCIN) 300 MG capsule Take 1 capsule (300 mg total) by mouth 3 (three) times daily. X 7 days 02/12/18   Zadie RhineWickline, Markon Jares, MD  ibuprofen (ADVIL,MOTRIN) 200 MG tablet Take 400 mg by mouth every 6 (six) hours as needed.    [provider]  ondansetron (ZOFRAN ODT) 4 MG disintegrating tablet Take 1 tablet (4 mg total) by mouth every 8 (eight) hours as needed for nausea or vomiting. 02/04/18   Samuel JesterMcManus, Kathleen, DO    Family History Family History  Problem Relation Age of Onset  . Diabetes Paternal Grandfather   . Diabetes Maternal Grandmother   . Hypertension Father   . Hyperlipidemia Father   . Asthma Son   . Hypertension Mother   . Hyperlipidemia Mother     Social History Social History   Tobacco Use  . Smoking status: Current Every Day Smoker    Packs/day: 0.25    Years: 15.00    Pack years: 3.75    Types: Cigarettes  . Smokeless tobacco: Never Used  Substance Use Topics  . Alcohol use: Yes    Alcohol/week:  0.0 oz    Comment: occas  . Drug use: Yes    Types: Marijuana    Comment: 2 weeks ago     Allergies   Penicillins   Review of Systems Review of Systems  Constitutional: Negative for fever.  Gastrointestinal: Negative for vomiting.     Physical Exam Updated Vital Signs BP (!) 151/103 (BP Location: Left Arm)   Pulse 73   Temp 98 F (36.7 C) (Oral)   Resp 18   Ht 1.626 m (5\' 4" )   Wt 108.9 kg (240 lb)   SpO2 100%   BMI 41.20 kg/m   Physical Exam CONSTITUTIONAL: Well developed/well nourished HEAD AND FACE: Normocephalic/atraumatic EYES: EOMI/PERRL ENMT: Mucous membranes moist.  Poor dentition.  No trismus.  No focal abscess noted. Tenderness to left upper molar.   NECK: supple no meningeal signs CV: S1/S2 noted, no murmurs/rubs/gallops noted LUNGS: Lungs are clear to auscultation bilaterally, no apparent distress ABDOMEN: soft, nontender, no rebound or guarding NEURO: Pt is awake/alert, moves all extremitiesx4 EXTREMITIES:full ROM SKIN: warm, color normal   ED  Treatments / Results  Labs (all labs ordered are listed, but only abnormal results are displayed) Labs Reviewed - No data to display  EKG None  Radiology No results found.  Procedures Procedures (including critical care time)  Medications Ordered in ED Medications  clindamycin (CLEOCIN) capsule 300 mg (has no administration in time range)  ibuprofen (ADVIL,MOTRIN) tablet 400 mg (has no administration in time range)  HYDROcodone-acetaminophen (NORCO/VICODIN) 5-325 MG per tablet 1 tablet (has no administration in time range)     Initial Impression / Assessment and Plan / ED Course  I have reviewed the triage vital signs and the nursing notes.   Encouraged to f/u with dental ASAP   Final Clinical Impressions(s) / ED Diagnoses   Final diagnoses:  Pain, dental    ED Discharge Orders        Ordered    clindamycin (CLEOCIN) 300 MG capsule  3 times daily     02/12/18 0314         Zadie Rhine, MD 02/12/18 [phone]

## 2018-02-12 NOTE — ED Triage Notes (Signed)
Pt c/o tooth pain to left upper mouth that started yesterday; pt states she has taken ibuprofen and used orajel without relief

## 2018-04-15 ENCOUNTER — Encounter (HOSPITAL_COMMUNITY): Payer: Self-pay

## 2018-04-15 ENCOUNTER — Emergency Department (HOSPITAL_COMMUNITY)
Admission: EM | Admit: 2018-04-15 | Discharge: 2018-04-15 | Disposition: A | Discharge status: Home / Self Care | Payer: 59 | Attending: Emergency Medicine | Admitting: Emergency Medicine

## 2018-04-15 ENCOUNTER — Other Ambulatory Visit: Payer: Self-pay

## 2018-04-15 ENCOUNTER — Emergency Department (HOSPITAL_COMMUNITY): Payer: 59

## 2018-04-15 DIAGNOSIS — F17210 Nicotine dependence, cigarettes, uncomplicated: Secondary | ICD-10-CM | POA: Diagnosis not present

## 2018-04-15 DIAGNOSIS — M5416 Radiculopathy, lumbar region: Secondary | ICD-10-CM | POA: Insufficient documentation

## 2018-04-15 DIAGNOSIS — R202 Paresthesia of skin: Secondary | ICD-10-CM | POA: Diagnosis present

## 2018-04-15 DIAGNOSIS — M9983 Other biomechanical lesions of lumbar region: Secondary | ICD-10-CM | POA: Insufficient documentation

## 2018-04-15 DIAGNOSIS — M48061 Spinal stenosis, lumbar region without neurogenic claudication: Secondary | ICD-10-CM

## 2018-04-15 LAB — BASIC METABOLIC PANEL
Anion gap: 8 (ref 5–15)
BUN: 8 mg/dL (ref 6–20)
CALCIUM: 9.3 mg/dL (ref 8.9–10.3)
CO2: 26 mmol/L (ref 22–32)
CREATININE: 0.76 mg/dL (ref 0.44–1.00)
Chloride: 102 mmol/L (ref 101–111)
GFR calc Af Amer: >60 mL/min (ref >60)
Glucose, Bld: 213 mg/dL — ABNORMAL HIGH (ref 65–99)
Potassium: 3.9 mmol/L (ref 3.5–5.1)
Sodium: 136 mmol/L (ref 135–145)

## 2018-04-15 LAB — D-DIMER, QUANTITATIVE: D-Dimer, Quant: <0.27 ug/mL-FEU (ref 0.00–0.50)

## 2018-04-15 LAB — POC URINE PREG, ED: Preg Test, Ur: NEGATIVE

## 2018-04-15 MED ORDER — DICLOFENAC SODIUM 75 MG PO TBEC: 75.0000 mg | DELAYED_RELEASE_TABLET | Freq: Two times a day (BID) | ORAL | 0 refills | Status: DC

## 2018-04-15 MED ORDER — CYCLOBENZAPRINE HCL 10 MG PO TABS: 10.0000 mg | ORAL_TABLET | Freq: Three times a day (TID) | ORAL | 0 refills | Status: DC

## 2018-04-15 MED ORDER — DEXAMETHASONE 4 MG PO TABS: 4.0000 mg | ORAL_TABLET | Freq: Two times a day (BID) | ORAL | 0 refills | Status: DC

## 2018-04-15 NOTE — Discharge Instructions (Addendum)
Your blood work suggest that your potassium and calcium are within normal limits.  Your blood work is negative for suggesting evidence of a blood clot.  Your CT scan shows a condition called radiculitis at the L3 area on the left more than on the right.  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  Please see Dr. Everardo Pacific to discuss management of these issues.  Please use caution getting around as you may notice numbness and tingling of your lower extremity from time to time.  Please use Flexeril 3 times daily for spasm pain. This medication may cause drowsiness. Please do not drink, drive, or participate in activity that requires concentration while taking this medication.  Please use diclofenac and Decadron 2 times daily for inflammation.  Please schedule an appointment with Dr. Everardo Pacific as soon as possible concerning this. Your blood pressure is elevated today, please have this rechecked soon by your primary physician or the local health department.

## 2018-04-15 NOTE — ED Triage Notes (Signed)
Pt is stating that she is having left knee numbness and tingling. States this has been happening for 2 weeks. Pt states she fell and caught herself on her knees. Legs are warm to the touch and has pedal pulses. Good color. Pt states she feels like her legs "go to sleep." Pt states knee will swell on and off as well.

## 2018-04-15 NOTE — ED Provider Notes (Signed)
[facility] EMERGENCY DEPARTMENT Provider Note   CSN: [id number] Arrival date & time: 04/15/18  1308     History   Chief Complaint Chief Complaint  Patient presents with  . Leg Injury    HPI Brittney Caldwell is a 37 y.o. female.  Patient is a 37 year old female who presents to the emergency department with a complaint of numbness and tingling from the left knee down to the foot.  The patient states she has not had any recent injury or trauma to the area.  She has not had any recent back surgery or back injury that she is aware of.  She states that this seemed to have been happening about starting about 2 weeks ago and has been getting progressively worse.  2 weeks ago she had a near fall, and caught herself on her hands and knees, but she did not feel like she had injury to the knee to the point of causing this problem.  No fever, chills, or hot joint appreciated.  She has not had any loss of bowel or bladder function.  No other symptoms reported.     History reviewed. No pertinent past medical history.  There are no active problems to display for this patient.   History reviewed. No pertinent surgical history.   OB History    Gravida  1   Para  1   Term      Preterm  1   AB      Living        SAB      TAB      Ectopic      Multiple      Live Births               Home Medications    Prior to Admission medications   Medication Sig Start Date End Date Taking? Authorizing Provider  clindamycin (CLEOCIN) 300 MG capsule Take 1 capsule (300 mg total) by mouth 3 (three) times daily. X 7 days 02/12/18   Zadie Rhine, MD  ibuprofen (ADVIL,MOTRIN) 200 MG tablet Take 400 mg by mouth every 6 (six) hours as needed.    [provider]  ondansetron (ZOFRAN ODT) 4 MG disintegrating tablet Take 1 tablet (4 mg total) by mouth every 8 (eight) hours as needed for nausea or vomiting. 02/04/18   Samuel Jester, DO    Family History Family History  Problem  Relation Age of Onset  . Diabetes Paternal Grandfather   . Diabetes Maternal Grandmother   . Hypertension Father   . Hyperlipidemia Father   . Asthma Son   . Hypertension Mother   . Hyperlipidemia Mother     Social History Social History   Tobacco Use  . Smoking status: Current Every Day Smoker    Packs/day: 0.25    Years: 15.00    Pack years: 3.75    Types: Cigarettes  . Smokeless tobacco: Never Used  Substance Use Topics  . Alcohol use: Yes    Alcohol/week: 0.0 oz    Comment: occas  . Drug use: Yes    Types: Marijuana    Comment: 2 weeks ago     Allergies   Penicillins   Review of Systems Review of Systems  Constitutional: Negative for activity change.       All ROS Neg except as noted in HPI  HENT: Negative for nosebleeds.   Eyes: Negative for photophobia and discharge.  Respiratory: Negative for cough, shortness of breath and wheezing.  Cardiovascular: Negative for chest pain and palpitations.  Gastrointestinal: Negative for abdominal pain and blood in stool.  Genitourinary: Negative for dysuria, frequency and hematuria.  Musculoskeletal: Negative for arthralgias, back pain and neck pain.  Skin: Negative.   Neurological: Positive for numbness. Negative for dizziness, seizures and speech difficulty.  Psychiatric/Behavioral: Negative for confusion and hallucinations.     Physical Exam Updated Vital Signs BP (!) 142/100 (BP Location: Right Arm)   Pulse 87   Temp 98.3 F (36.8 C) (Oral)   LMP 04/09/2018   SpO2 99%   Physical Exam  Constitutional: She is oriented to person, place, and time. She appears well-developed and well-nourished.  Non-toxic appearance.  HENT:  Head: Normocephalic.  Right Ear: Tympanic membrane and external ear normal.  Left Ear: Tympanic membrane and external ear normal.  Eyes: Pupils are equal, round, and reactive to light. EOM and lids are normal.  Neck: Normal range of motion. Neck supple. Carotid bruit is not present.    Cardiovascular: Normal rate, regular rhythm, normal heart sounds, intact distal pulses and normal pulses.  Pulmonary/Chest: Breath sounds normal. No respiratory distress.  Abdominal: Soft. Bowel sounds are normal. There is no tenderness. There is no guarding.  Musculoskeletal: Normal range of motion.  There is mild pain to percussion and palpation at the lower lumbar area.  There is some pain radiating into the left buttocks with range of motion of the lower back.  There is no palpable step-off of the lumbar spine.  There are no hot areas appreciated.  There is good range of motion of the left hip, knee, ankle, and toes.  The patient has a 2+ dorsalis pedis and posterior tibial pulse.  The capillary refill is less than 2 seconds on the left.  No calf tenderness appreciated, and no calf swelling.  Achilles tendon is intact.  Lymphadenopathy:       Head (right side): No submandibular adenopathy present.       Head (left side): No submandibular adenopathy present.    She has no cervical adenopathy.  Neurological: She is alert and oriented to person, place, and time. She has normal strength. No cranial nerve deficit or sensory deficit. Coordination and gait normal.  Skin: Skin is warm and dry.  Psychiatric: She has a normal mood and affect. Her speech is normal.  Nursing note and vitals reviewed.    ED Treatments / Results  Labs (all labs ordered are listed, but only abnormal results are displayed) Labs Reviewed - No data to display  EKG None  Radiology No results found.  Procedures Procedures (including critical care time)  Medications Ordered in ED Medications - No data to display   Initial Impression / Assessment and Plan / ED Course  I have reviewed the triage vital signs and the nursing notes.  Pertinent labs & imaging results that were available during my care of the patient were reviewed by me and considered in my medical decision making (see chart for details).       Final Clinical Impressions(s) / ED Diagnoses MDM  Blood pressure is elevated initially.  Vital signs otherwise within normal limits.  Pulse oximetry is 99% on room air.  Within normal limits by my interpretation.  With the exception of the numbness from the knee to the toes, no gross neurologic deficits appreciated.  No vascular compromise appreciated.  Basic metabolic panel shows glucose to be slightly elevated.  Otherwise within normal limits.  In particular the potassium and calcium are well within normal  limits.  The d-dimer is negative for acute problem.  The CT scan of the lumbar spine questions left L3 radiculitis, and moderate L5-S1 foraminal stenosis greater on the left.  I discussed these findings with the patient in terms which she understands.  There is no evidence at this time for cauda equina or other emergent changes, however with the patient having these sensations of numbness and tingling and then of these skin changes on CT scan, I have asked her to see the orthopedic specialist for additional evaluation and possible MRI evaluation.  The patient will be treated with a muscle relaxer, anti-inflammatory pain medication, and a short course of steroid to see if these will help with her symptoms at this time.  Patient is in agreement with this plan.   Final diagnoses:  Foraminal stenosis of lumbar region  Left lumbar radiculitis    ED Discharge Orders        Ordered    cyclobenzaprine (FLEXERIL) 10 MG tablet  3 times daily     04/15/18 1558    dexamethasone (DECADRON) 4 MG tablet  2 times daily with meals     04/15/18 1558    diclofenac (VOLTAREN) 75 MG EC tablet  2 times daily     04/15/18 1558       Ivery QualeBryant, Abir Craine, PA-C 04/15/18 1618    Bethann BerkshireZammit, Joseph, MD 04/22/18 1313

## 2018-04-21 ENCOUNTER — Other Ambulatory Visit: Payer: No Typology Code available for payment source | Admitting: Advanced Practice Midwife

## 2018-05-03 ENCOUNTER — Ambulatory Visit (INDEPENDENT_AMBULATORY_CARE_PROVIDER_SITE_OTHER): Payer: 59 | Admitting: Advanced Practice Midwife

## 2018-05-03 ENCOUNTER — Encounter: Payer: Self-pay | Admitting: Advanced Practice Midwife

## 2018-05-03 VITALS — BP 117/72 | HR 91 | Ht 64.5 in | Wt 239.0 lb

## 2018-05-03 DIAGNOSIS — Z113 Encounter for screening for infections with a predominantly sexual mode of transmission: Secondary | ICD-10-CM

## 2018-05-03 DIAGNOSIS — Z01411 Encounter for gynecological examination (general) (routine) with abnormal findings: Secondary | ICD-10-CM | POA: Diagnosis not present

## 2018-05-03 DIAGNOSIS — Z01419 Encounter for gynecological examination (general) (routine) without abnormal findings: Secondary | ICD-10-CM

## 2018-05-03 DIAGNOSIS — Z3202 Encounter for pregnancy test, result negative: Secondary | ICD-10-CM | POA: Diagnosis not present

## 2018-05-03 DIAGNOSIS — N926 Irregular menstruation, unspecified: Secondary | ICD-10-CM

## 2018-05-03 LAB — POCT URINE PREGNANCY: Preg Test, Ur: NEGATIVE

## 2018-05-03 NOTE — Progress Notes (Signed)
Brittney Caldwell 37 y.o.  Vitals:   05/03/18 0841  BP: 117/72  Pulse: 91     Filed Weights   05/03/18 0841  Weight: 239 lb (108.4 kg)    Past Medical History: History reviewed. No pertinent past medical history.  Past Surgical History: History reviewed. No pertinent surgical history.  Family History: Family History  Problem Relation Age of Onset  . Diabetes Paternal Grandfather   . Diabetes Maternal Grandmother   . Hypertension Father   . Hyperlipidemia Father   . Asthma Son   . Hypertension Mother   . Hyperlipidemia Mother     Social History: Social History   Tobacco Use  . Smoking status: Current Every Day Smoker    Packs/day: 0.25    Years: 15.00    Pack years: 3.75    Types: Cigarettes  . Smokeless tobacco: Never Used  Substance Use Topics  . Alcohol use: Yes    Alcohol/week: 0.0 oz    Comment: occas  . Drug use: Yes    Types: Marijuana    Comment: 2 weeks ago    Allergies:  Allergies  Allergen Reactions  . Penicillins Hives    Has patient had a PCN reaction causing immediate rash, facial/tongue/throat swelling, SOB or lightheadedness with hypotension: Yes Has patient had a PCN reaction causing severe rash involving mucus membranes or skin necrosis: No Has patient had a PCN reaction that required hospitalization No Has patient had a PCN reaction occurring within the last 10 years: No If all of the above answers are "NO", then may proceed with Cephalosporin use.       Current Outpatient Medications:  .  clindamycin (CLEOCIN) 300 MG capsule, Take 1 capsule (300 mg total) by mouth 3 (three) times daily. X 7 days, Disp: 21 capsule, Rfl: 0 .  cyclobenzaprine (FLEXERIL) 10 MG tablet, Take 1 tablet (10 mg total) by mouth 3 (three) times daily., Disp: 20 tablet, Rfl: 0 .  dexamethasone (DECADRON) 4 MG tablet, Take 1 tablet (4 mg total) by mouth 2 (two) times daily with a meal., Disp: 10 tablet, Rfl: 0 .  diclofenac (VOLTAREN) 75 MG EC tablet, Take 1  tablet (75 mg total) by mouth 2 (two) times daily., Disp: 12 tablet, Rfl: 0 .  ibuprofen (ADVIL,MOTRIN) 200 MG tablet, Take 400 mg by mouth every 6 (six) hours as needed., Disp: , Rfl:  .  ondansetron (ZOFRAN ODT) 4 MG disintegrating tablet, Take 1 tablet (4 mg total) by mouth every 8 (eight) hours as needed for nausea or vomiting., Disp: 6 tablet, Rfl: 0  History of Present Illness: Here for FP physical. Pap last year normal. Wants to be pregnant, but not really stressng about it anymore.  Periods very irregular.  Didn't get semen analysis or OV Predictors. Seeing PT for back. Had "knot" come and go on left upper abdomen, crampy, sometimes painful, over the past few weeks Not there now.    Review of Systems   Patient denies any headaches, blurred vision, shortness of breath, chest pain, abdominal pain, problems with bowel movements, urination, or intercourse.   Physical Exam: General:  Well developed, well nourished, no acute distress Skin:  Warm and dry Neck:  Midline trachea, normal thyroid Lungs; Clear to auscultation bilaterally Breast:  No dominant palpable mass, retraction, or nipple discharge Cardiovascular: Regular rate and rhythm Abdomen:  Soft, non tender, no hepatosplenomegaly. No knot palpable in area where pt felt it earlier Pelvic:  External genitalia is normal in appearance.  The  vagina is normal in appearance.  The cervix is bulbous.  Uterus is felt to be normal size, shape, and contour.  No adnexal masses or tenderness noted. Exma limited by habitus Extremities:  No swelling or varicosities noted Psych:  No mood changes.     Impression: Normal GYN exam Irregular menses ? hernina     Plan: continue PNV OV PRE Tests if so inclined If "knot" gets more troublesome/prevalent, FU

## 2018-05-04 LAB — RPR: RPR: NONREACTIVE

## 2018-05-04 LAB — HIV ANTIBODY (ROUTINE TESTING W REFLEX): HIV Screen 4th Generation wRfx: NONREACTIVE

## 2018-05-05 LAB — GC/CHLAMYDIA PROBE AMP
Chlamydia trachomatis, NAA: NEGATIVE
NEISSERIA GONORRHOEAE BY PCR: NEGATIVE

## 2018-06-02 ENCOUNTER — Telehealth: Payer: Self-pay | Admitting: *Deleted

## 2018-06-02 NOTE — Telephone Encounter (Signed)
Pt called stating that she has been bleeding heavily for almost 2 weeks. She states that she has been passing clots. She states that this morning she passed a clot the size of a golf ball while in the shower. She states that she is changing a pad about every hour. No c/o dizziness or lightheadedness. She states that she is fatigued but works 3rd shift so it may be from that. Pt given an appt with Dr Despina HiddenEure for evaluation tomorrow morning.

## 2018-06-03 ENCOUNTER — Ambulatory Visit (INDEPENDENT_AMBULATORY_CARE_PROVIDER_SITE_OTHER): Payer: 59 | Admitting: Obstetrics & Gynecology

## 2018-06-03 ENCOUNTER — Other Ambulatory Visit: Payer: Self-pay

## 2018-06-03 ENCOUNTER — Encounter (INDEPENDENT_AMBULATORY_CARE_PROVIDER_SITE_OTHER): Payer: Self-pay

## 2018-06-03 ENCOUNTER — Encounter: Payer: Self-pay | Admitting: Obstetrics & Gynecology

## 2018-06-03 VITALS — BP 120/76 | HR 90 | Ht 64.5 in | Wt 244.0 lb

## 2018-06-03 DIAGNOSIS — N921 Excessive and frequent menstruation with irregular cycle: Secondary | ICD-10-CM

## 2018-06-03 DIAGNOSIS — Z3202 Encounter for pregnancy test, result negative: Secondary | ICD-10-CM

## 2018-06-03 LAB — POCT HEMOGLOBIN: Hemoglobin: 12.2 g/dL (ref 12.2–16.2)

## 2018-06-03 LAB — POCT URINE PREGNANCY: Preg Test, Ur: NEGATIVE

## 2018-06-03 MED ORDER — MEGESTROL ACETATE 40 MG PO TABS: ORAL_TABLET | ORAL | 3 refills | Status: DC

## 2018-06-03 NOTE — Addendum Note (Signed)
Addended by: Tish FredericksonLANCASTER, Dhrithi Riche A on: 06/03/2018 09:37 AM   Modules accepted: Orders

## 2018-06-03 NOTE — Progress Notes (Signed)
Chief Complaint  Patient presents with  . Menorrhagia    x2 weeks with golf ball sized clot      37 y.o. [id number] Patient's last menstrual period was 05/23/2018. The current method of family planning is none.  Outpatient Encounter Medications as of 06/03/2018  Medication Sig  . ibuprofen (ADVIL,MOTRIN) 200 MG tablet Take 400 mg by mouth every 6 (six) hours as needed.  . megestrol (MEGACE) 40 MG tablet 3 tablets a day for 5 days, 2 tablets a day for 5 days then 1 tablet daily  . [DISCONTINUED] clindamycin (CLEOCIN) 300 MG capsule Take 1 capsule (300 mg total) by mouth 3 (three) times daily. X 7 days  . [DISCONTINUED] cyclobenzaprine (FLEXERIL) 10 MG tablet Take 1 tablet (10 mg total) by mouth 3 (three) times daily.  . [DISCONTINUED] dexamethasone (DECADRON) 4 MG tablet Take 1 tablet (4 mg total) by mouth 2 (two) times daily with a meal.  . [DISCONTINUED] diclofenac (VOLTAREN) 75 MG EC tablet Take 1 tablet (75 mg total) by mouth 2 (two) times daily.  . [DISCONTINUED] ondansetron (ZOFRAN ODT) 4 MG disintegrating tablet Take 1 tablet (4 mg total) by mouth every 8 (eight) hours as needed for nausea or vomiting.   No facility-administered encounter medications on file as of 06/03/2018.     Subjective Work in appt Bleeding for 2 weeks, minimal cramps Golf ball size clots Has long history of similar episodes History reviewed. No pertinent past medical history.  History reviewed. No pertinent surgical history.  OB History    Gravida  1   Para  1   Term      Preterm  1   AB      Living  1     SAB      TAB      Ectopic      Multiple      Live Births              Allergies  Allergen Reactions  . Penicillins Hives    Has patient had a PCN reaction causing immediate rash, facial/tongue/throat swelling, SOB or lightheadedness with hypotension: Yes Has patient had a PCN reaction causing severe rash involving mucus membranes or skin necrosis: No Has patient had  a PCN reaction that required hospitalization No Has patient had a PCN reaction occurring within the last 10 years: No If all of the above answers are "NO", then may proceed with Cephalosporin use.     Social History   Socioeconomic History  . Marital status: Single    Spouse name: Not on file  . Number of children: Not on file  . Years of education: Not on file  . Highest education level: Not on file  Occupational History  . Not on file  Social Needs  . Financial resource strain: Not on file  . Food insecurity:    Worry: Not on file    Inability: Not on file  . Transportation needs:    Medical: Not on file    Non-medical: Not on file  Tobacco Use  . Smoking status: Current Every Day Smoker    Packs/day: 0.25    Years: 15.00    Pack years: 3.75    Types: Cigarettes  . Smokeless tobacco: Never Used  Substance and Sexual Activity  . Alcohol use: Yes    Alcohol/week: 0.0 oz    Comment: occas  . Drug use: Yes    Types: Marijuana    Comment: 2 weeks  ago  . Sexual activity: Yes    Birth control/protection: None  Lifestyle  . Physical activity:    Days per week: Not on file    Minutes per session: Not on file  . Stress: Not on file  Relationships  . Social connections:    Talks on phone: Not on file    Gets together: Not on file    Attends religious service: Not on file    Active member of club or organization: Not on file    Attends meetings of clubs or organizations: Not on file    Relationship status: Not on file  Other Topics Concern  . Not on file  Social History Narrative  . Not on file    Family History  Problem Relation Age of Onset  . Diabetes Paternal Grandfather   . Diabetes Maternal Grandmother   . Hypertension Father   . Hyperlipidemia Father   . Asthma Son   . Hypertension Mother   . Hyperlipidemia Mother     Medications:       Current Outpatient Medications:  .  ibuprofen (ADVIL,MOTRIN) 200 MG tablet, Take 400 mg by mouth every 6 (six)  hours as needed., Disp: , Rfl:  .  megestrol (MEGACE) 40 MG tablet, 3 tablets a day for 5 days, 2 tablets a day for 5 days then 1 tablet daily, Disp: 45 tablet, Rfl: 3  Objective Blood pressure 120/76, pulse 90, height 5' 4.5" (1.638 m), weight 244 lb (110.7 kg), last menstrual period 05/23/2018.  General WDWN female NAD Vulva:  normal appearing vulva with no masses, tenderness or lesions Vagina:  normal mucosa, no lesions or discharge noted, blood in the vault Cervix:  no cervical motion tenderness and no lesions Uterus:  normal size, contour, position, consistency, mobility, non-tender Adnexa: ovaries:present,  normal adnexa in size, nontender and no masses   Pertinent ROS No burning with urination, frequency or urgency No nausea, vomiting or diarrhea Nor fever chills or other constitutional symptoms   Labs or studies Hemoglobin 12.2    Impression Diagnoses this Encounter::   ICD-10-CM   1. Menometrorrhagia N92.1 US PELVIS (TRANSABDOMINAL ONLY)    US PELVIS TRANSVANGINAL NON-OB (TV ONLY)    Established relevant diagnosis(es):   Plan/Recommendations: Meds ordered this encounter  Medications  . megestrol (MEGACE) 40 MG tablet    Sig: 3 tablets a day for 5 days, 2 tablets a day for 5 days then 1 tablet daily    Dispense:  45 tablet    Refill:  3    Labs or Scans Ordered: Orders Placed This Encounter  Procedures  . US PELVIS (TRANSABDOMINAL ONLY)  . US PELVIS TRANSVANGINAL NON-OB (TV ONLY)    Management:: Megace algorithm for 6 weeks Sonogram to evaluate endometrium uterine anatomy Then make decisions from there based on response and sonogram findings  Follow up Return in about 6 weeks (around 07/15/2018) for GYN sono, Follow up, with Dr Despina HiddenEure.       All questions were answered.

## 2018-07-08 ENCOUNTER — Ambulatory Visit: Payer: 59 | Admitting: Obstetrics & Gynecology

## 2018-07-08 ENCOUNTER — Other Ambulatory Visit: Payer: 59

## 2018-07-15 ENCOUNTER — Other Ambulatory Visit: Payer: 59

## 2018-07-29 ENCOUNTER — Other Ambulatory Visit: Payer: 59

## 2018-07-29 ENCOUNTER — Ambulatory Visit: Payer: 59 | Admitting: Obstetrics & Gynecology

## 2018-08-21 ENCOUNTER — Emergency Department (HOSPITAL_COMMUNITY): Payer: 59

## 2018-08-21 ENCOUNTER — Other Ambulatory Visit: Payer: Self-pay

## 2018-08-21 ENCOUNTER — Emergency Department (HOSPITAL_COMMUNITY)
Admission: EM | Admit: 2018-08-21 | Discharge: 2018-08-21 | Disposition: A | Discharge status: Home / Self Care | Payer: 59 | Attending: Emergency Medicine | Admitting: Emergency Medicine

## 2018-08-21 ENCOUNTER — Encounter (HOSPITAL_COMMUNITY): Payer: Self-pay

## 2018-08-21 DIAGNOSIS — J069 Acute upper respiratory infection, unspecified: Secondary | ICD-10-CM | POA: Insufficient documentation

## 2018-08-21 DIAGNOSIS — F17210 Nicotine dependence, cigarettes, uncomplicated: Secondary | ICD-10-CM | POA: Diagnosis not present

## 2018-08-21 DIAGNOSIS — R05 Cough: Secondary | ICD-10-CM | POA: Diagnosis present

## 2018-08-21 MED ORDER — GUAIFENESIN-CODEINE 100-10 MG/5ML PO SOLN: 10.0000 mL | Freq: Three times a day (TID) | ORAL | 0 refills | Status: DC | PRN

## 2018-08-21 NOTE — ED Provider Notes (Signed)
[facility] EMERGENCY DEPARTMENT Provider Note   CSN: [id number] Arrival date & time: 08/21/18  0746     History   Chief Complaint Chief Complaint  Patient presents with  . Shortness of Breath    HPI Brittney Caldwell is a 37 y.o. female.  She is complaining of 1 week of runny nose sore throat blocked ears cough and soreness in her upper chest with coughing.  It is productive of mostly clear sputum with a little bit yellow.  She felt hot and cold but has not been running a fever.  She had a headache.  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  She is tried nothing for her symptoms.  The history is provided by the patient.  URI   This is a new problem. The current episode started more than 1 week ago. The problem has been gradually worsening. There has been no fever. Associated symptoms include chest pain, ear pain, headaches, rhinorrhea, sneezing, sore throat and cough. Pertinent negatives include no abdominal pain, no diarrhea, no nausea, no vomiting, no dysuria, no neck pain and no rash. She has tried nothing for the symptoms. The treatment provided no relief.    History reviewed. No pertinent past medical history.  There are no active problems to display for this patient.   History reviewed. No pertinent surgical history.   OB History    Gravida  1   Para  1   Term      Preterm  1   AB      Living  1     SAB      TAB      Ectopic      Multiple      Live Births               Home Medications    Prior to Admission medications   Medication Sig Start Date End Date Taking? Authorizing Provider  ibuprofen (ADVIL,MOTRIN) 200 MG tablet Take 400 mg by mouth every 6 (six) hours as needed.    [provider]  megestrol (MEGACE) 40 MG tablet 3 tablets a day for 5 days, 2 tablets a day for 5 days then 1 tablet daily 06/03/18   Lazaro Arms, MD    Family History Family History  Problem  Relation Age of Onset  . Diabetes Paternal Grandfather   . Diabetes Maternal Grandmother   . Hypertension Father   . Hyperlipidemia Father   . Asthma Son   . Hypertension Mother   . Hyperlipidemia Mother     Social History Social History   Tobacco Use  . Smoking status: Current Every Day Smoker    Packs/day: 0.25    Years: 15.00    Pack years: 3.75    Types: Cigarettes  . Smokeless tobacco: Never Used  Substance Use Topics  . Alcohol use: Yes    Alcohol/week: 0.0 standard drinks    Comment: occas  . Drug use: Yes    Types: Marijuana    Comment: 2 weeks ago     Allergies   Penicillins   Review of Systems Review of Systems  Constitutional: Negative for fever.  HENT: Positive for ear pain, rhinorrhea, sneezing and sore throat.   Eyes: Negative for visual disturbance.  Respiratory: Positive for cough. Negative for shortness of breath.   Cardiovascular: Positive for chest pain.  Gastrointestinal: Negative for abdominal pain, diarrhea, nausea and vomiting.  Genitourinary: Negative  for dysuria.  Musculoskeletal: Negative for neck pain.  Skin: Negative for rash.  Neurological: Positive for headaches.     Physical Exam Updated Vital Signs BP 127/79 (BP Location: Right Arm)   Pulse 93   Temp 98.1 F (36.7 C) (Oral)   Resp 14   Ht 5' 4.5" (1.638 m)   Wt 104.3 kg   LMP 08/14/2018   SpO2 100%   BMI 38.87 kg/m   Physical Exam  Constitutional: She appears well-developed and well-nourished. No distress.  HENT:  Head: Normocephalic and atraumatic.  Right Ear: Tympanic membrane and ear canal normal.  Left Ear: Tympanic membrane and ear canal normal.  Nose: Rhinorrhea present. Right sinus exhibits no maxillary sinus tenderness and no frontal sinus tenderness. Left sinus exhibits no maxillary sinus tenderness and no frontal sinus tenderness.  Eyes: Conjunctivae are normal.  Neck: Neck supple.  Cardiovascular: Normal rate, regular rhythm and intact distal pulses.    No murmur heard. Pulmonary/Chest: Effort normal and breath sounds normal. No respiratory distress.  Abdominal: Soft. There is no tenderness.  Musculoskeletal: She exhibits no edema.  Neurological: She is alert.  Skin: Skin is warm and dry.  Psychiatric: She has a normal mood and affect.  Nursing note and vitals reviewed.    ED Treatments / Results  Labs (all labs ordered are listed, but only abnormal results are displayed) Labs Reviewed - No data to display  EKG None  Radiology Dg Chest 2 View  Result Date: 08/21/2018 CLINICAL DATA:  37 year old female with cough and congestion for 2 days. EXAM: CHEST - 2 VIEW COMPARISON:  Chest radiographs 02/04/2018 and earlier. FINDINGS: Stable somewhat low lung volumes. Mediastinal contours remain normal. Visualized tracheal air column is within normal limits. Lung parenchyma appears stable and clear. No pneumothorax or pleural effusion. Negative visible bowel gas pattern. No acute osseous abnormality identified. IMPRESSION: Negative.  No cardiopulmonary abnormality. Electronically Signed   By: Odessa Fleming M.D.   On: 08/21/2018 08:44    Procedures Procedures (including critical care time)  Medications Ordered in ED Medications - No data to display   Initial Impression / Assessment and Plan / ED Course  I have reviewed the triage vital signs and the nursing notes.  Pertinent labs & imaging results that were available during my care of the patient were reviewed by me and considered in my medical decision making (see chart for details).       Final Clinical Impressions(s) / ED Diagnoses   Final diagnoses:  Upper respiratory tract infection, unspecified type    ED Discharge Orders         Ordered    guaiFENesin-codeine 100-10 MG/5ML syrup  3 times daily PRN     08/21/18 0829           Terrilee Files, MD 08/21/18 [phone]

## 2018-08-21 NOTE — Discharge Instructions (Addendum)
You were evaluated in the emergency department for an upper respiratory infection.  This is likely viral and will improve on its own with time.  You should drink plenty of fluids and get rest.  Tylenol and ibuprofen for pain.  We are prescribing you cough medicine that has some medication and may make you tired.  Please return if high fevers or other concerning symptoms.

## 2018-08-21 NOTE — ED Triage Notes (Signed)
Pt has been coughing, had nasal drainage, a headache, and body aches. Last night pt had chills as well. No fevers present at home. 98.1 today. Has not taken any medications.

## 2018-09-17 ENCOUNTER — Emergency Department (HOSPITAL_COMMUNITY): Payer: 59

## 2018-09-17 ENCOUNTER — Other Ambulatory Visit: Payer: Self-pay

## 2018-09-17 ENCOUNTER — Emergency Department (HOSPITAL_COMMUNITY)
Admission: EM | Admit: 2018-09-17 | Discharge: 2018-09-17 | Disposition: A | Discharge status: Home / Self Care | Payer: 59 | Attending: Emergency Medicine | Admitting: Emergency Medicine

## 2018-09-17 ENCOUNTER — Encounter (HOSPITAL_COMMUNITY): Payer: Self-pay | Admitting: Emergency Medicine

## 2018-09-17 DIAGNOSIS — M25562 Pain in left knee: Secondary | ICD-10-CM

## 2018-09-17 DIAGNOSIS — F17210 Nicotine dependence, cigarettes, uncomplicated: Secondary | ICD-10-CM | POA: Diagnosis not present

## 2018-09-17 NOTE — ED Provider Notes (Signed)
[facility] EMERGENCY DEPARTMENT Provider Note   CSN: [id number] Arrival date & time: 09/17/18  1156     History   Chief Complaint Chief Complaint  Patient presents with  . Numbness    HPI Brittney Caldwell is a 37 y.o. female.  HPI   37 year old female presents today with complaints of pain to her left knee.  Patient notes a history of the same several months ago where she had swelling and pain to the left knee this resolved without complication.  She notes this started again 2 days ago with pain and swelling.  She notes pain radiating from the left lower back down to her toe.  She notes she has difficulty extending the leg, minor difficulty with flexion but is able to flex the knee.  She reports some numbness in her right great toe.  She denies any fever, warmth, history of gout or septic arthritis.  History reviewed. No pertinent past medical history.  There are no active problems to display for this patient.   History reviewed. No pertinent surgical history.   OB History    Gravida  1   Para  1   Term      Preterm  1   AB      Living  1     SAB      TAB      Ectopic      Multiple      Live Births               Home Medications    Prior to Admission medications   Medication Sig Start Date End Date Taking? Authorizing Provider  guaiFENesin-codeine 100-10 MG/5ML syrup Take 10 mLs by mouth 3 (three) times daily as needed for cough. 08/21/18   Terrilee Files, MD  ibuprofen (ADVIL,MOTRIN) 200 MG tablet Take 400 mg by mouth every 6 (six) hours as needed.    [provider]  megestrol (MEGACE) 40 MG tablet 3 tablets a day for 5 days, 2 tablets a day for 5 days then 1 tablet daily 06/03/18   Lazaro Arms, MD    Family History Family History  Problem Relation Age of Onset  . Diabetes Paternal Grandfather   . Diabetes Maternal Grandmother   . Hypertension Father   . Hyperlipidemia Father   . Asthma Son   . Hypertension Mother   .  Hyperlipidemia Mother     Social History Social History   Tobacco Use  . Smoking status: Current Every Day Smoker    Packs/day: 0.25    Years: 15.00    Pack years: 3.75    Types: Cigarettes  . Smokeless tobacco: Never Used  Substance Use Topics  . Alcohol use: Yes    Alcohol/week: 0.0 standard drinks    Comment: occasional  . Drug use: Yes    Types: Marijuana    Comment: 2 weeks ago     Allergies   Penicillins   Review of Systems Review of Systems  All other systems reviewed and are negative.    Physical Exam Updated Vital Signs BP 127/74 (BP Location: Right Arm)   Pulse 97   Temp (!) 97.5 F (36.4 C) (Oral)   Resp 16   Ht 5\' 4"  (1.626 m)   Wt 104.3 kg   LMP 09/10/2018   SpO2 100%   BMI 39.47 kg/m   Physical Exam  Constitutional: She is oriented to person, place, and time. She appears well-developed and well-nourished.  HENT:  Head: Normocephalic and atraumatic.  Eyes: Pupils are equal, round, and reactive to light. Conjunctivae are normal. Right eye exhibits no discharge. Left eye exhibits no discharge. No scleral icterus.  Neck: Normal range of motion. No JVD present. No tracheal deviation present.  Pulmonary/Chest: Effort normal. No stridor.  Musculoskeletal:  Bilateral knees symmetrical with no swelling or edema, no warmth to touch, minimal tenderness palpation of medial lateral joint lines on the left knee, unable to fully extend the left knee, extension to 90 degrees without difficulty-minor tenderness palpation of left upper gluteus, no midline spinal tenderness palpation  Neurological: She is alert and oriented to person, place, and time. Coordination normal.  Psychiatric: She has a normal mood and affect. Her behavior is normal. Judgment and thought content normal.  Nursing note and vitals reviewed.    ED Treatments / Results  Labs (all labs ordered are listed, but only abnormal results are displayed) Labs Reviewed - No data to  display  EKG None  Radiology Dg Knee Complete 4 Views Left  Result Date: 09/17/2018 CLINICAL DATA:  Knee pain EXAM: LEFT KNEE - COMPLETE 4+ VIEW COMPARISON:  None. FINDINGS: No acute fracture or dislocation. Mild tricompartment osteoarthritic change. IMPRESSION: No acute bony pathology. Electronically Signed   By: Jolaine Click M.D.   On: 09/17/2018 13:18    Procedures Procedures (including critical care time)  Medications Ordered in ED Medications - No data to display   Initial Impression / Assessment and Plan / ED Course  I have reviewed the triage vital signs and the nursing notes.  Pertinent labs & imaging results that were available during my care of the patient were reviewed by me and considered in my medical decision making (see chart for details).     37 year old female presents today with left-sided knee pain.  No acute bony abnormality, no signs of joint effusion or infection.  Low suspicion for septic arthritis gout.  Patient discharged with outpatient follow-up and strict return precautions.  Verbalized understanding and agreement to today's plan.  Final Clinical Impressions(s) / ED Diagnoses   Final diagnoses:  Acute pain of left knee    ED Discharge Orders    None       Eyvonne Mechanic, PA-C 09/17/18 1358    Bethann Berkshire, MD 09/17/18 1400

## 2018-09-17 NOTE — ED Triage Notes (Addendum)
Patient c/o numbness to left leg and foot x2-3 days. Per patient "leg and knee giving out with walking." Patient reports swelling and burning sensation in knee. Patient states original pain/numbness started in knee radiating down now pain starts in lower back and hip. Denies taking anything for pain.

## 2018-09-17 NOTE — Discharge Instructions (Addendum)
Please read attached information. If you experience any new or worsening signs or symptoms please return to the emergency room for evaluation. Please follow-up with your primary care provider or specialist as discussed.  °

## 2018-12-29 ENCOUNTER — Ambulatory Visit: Payer: Medicaid Other | Admitting: Adult Health

## 2019-09-07 IMAGING — CT CT L SPINE W/O CM
3 series · 12 of 33 positions shown, 14 images · non-contrast
Comparison: Acute abdominal radiographic series 02/04/2018.

CLINICAL DATA: 37-year-old female with numbness and tingling
radiating to the left knee for 2 weeks after fall.

EXAM:
CT LUMBAR SPINE WITHOUT CONTRAST
TECHNIQUE: Multidetector CT imaging of the lumbar spine was performed without
intravenous contrast administration. Multiplanar CT image
reconstructions were also generated.

[Series 4: l spine soft · axial · 0.37mm/px · z∈[+1128,+1290]mm · 4 of 117 slices shown, 5 images]
[im 18/117  soft-tissue]
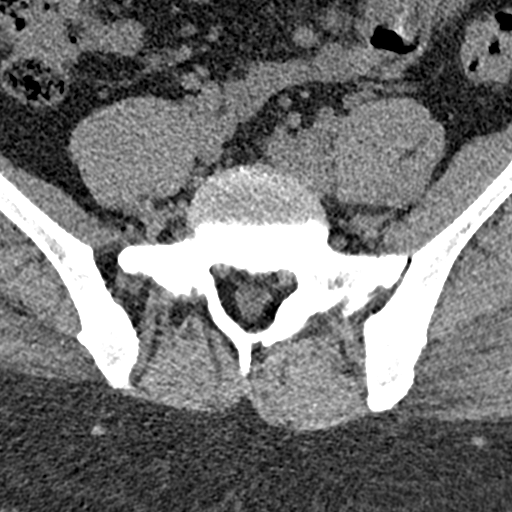
[im 18/117  bone]
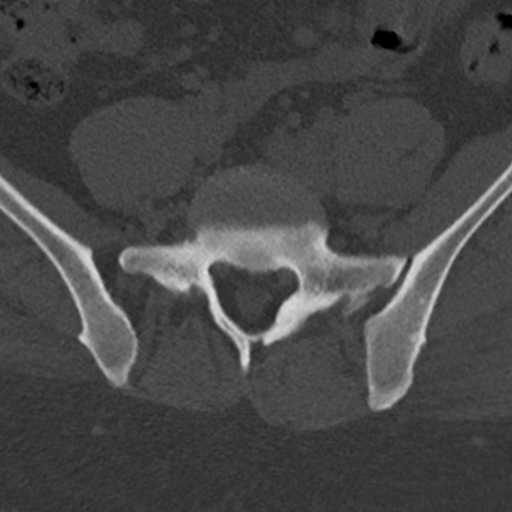
[im 45/117  bone]
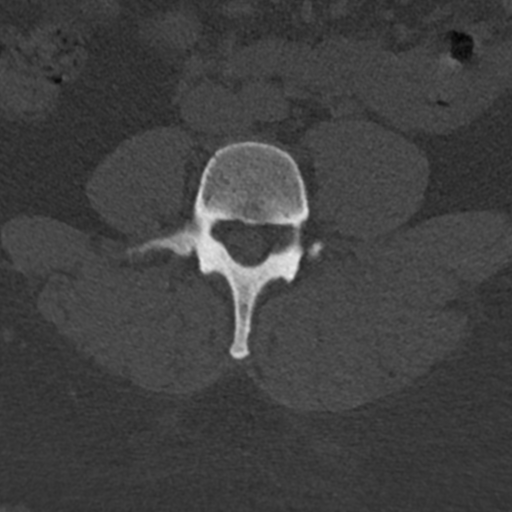
[im 72/117  bone]
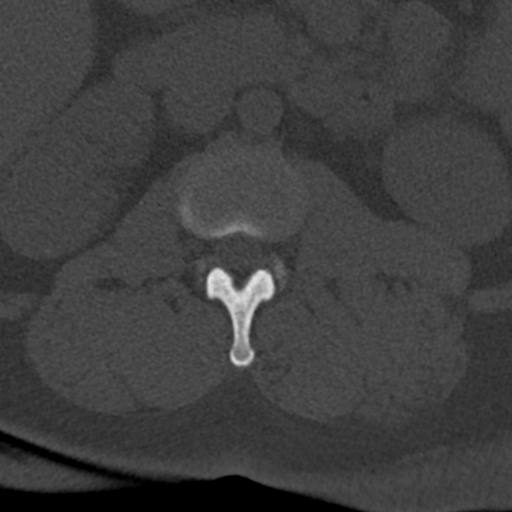
[im 99/117  bone]
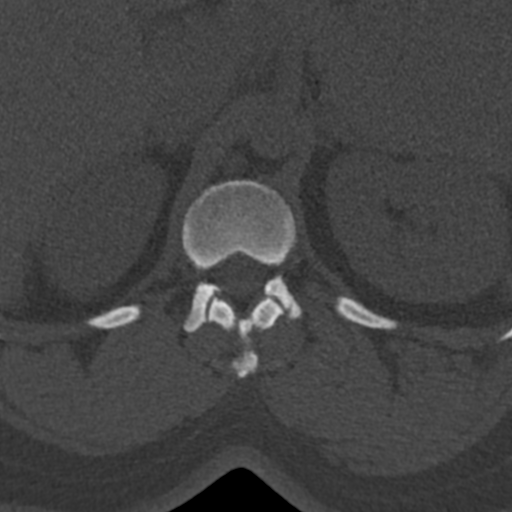

[Series 5: sagittal bone · sagittal · 0.31mm/px · 5 of 85 slices shown, 6 images]
[im 29/85  bone]
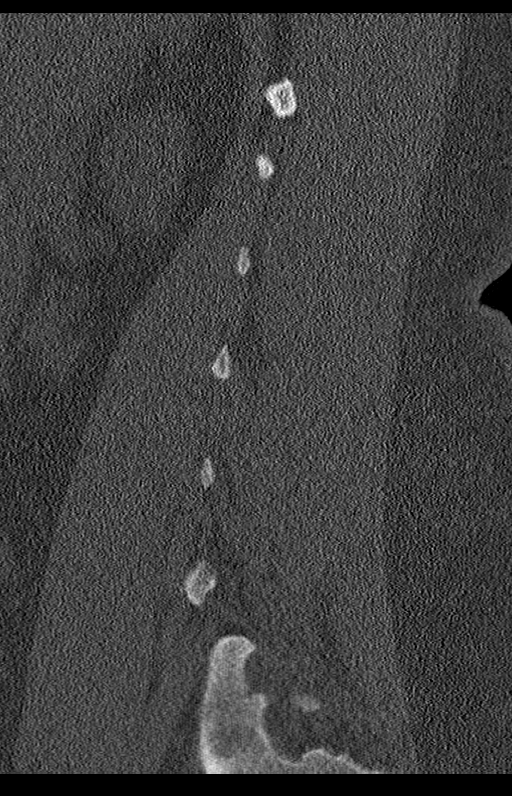
[im 36/85  bone]
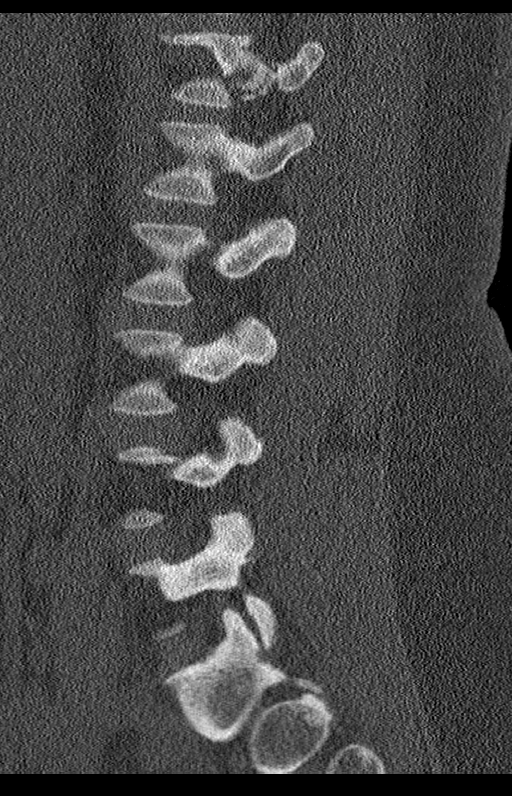
[im 43/85  soft-tissue]
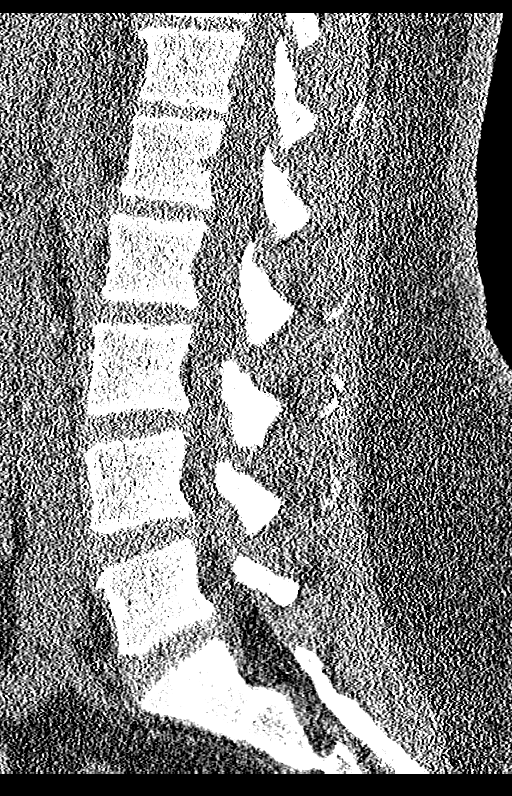
[im 43/85  bone]
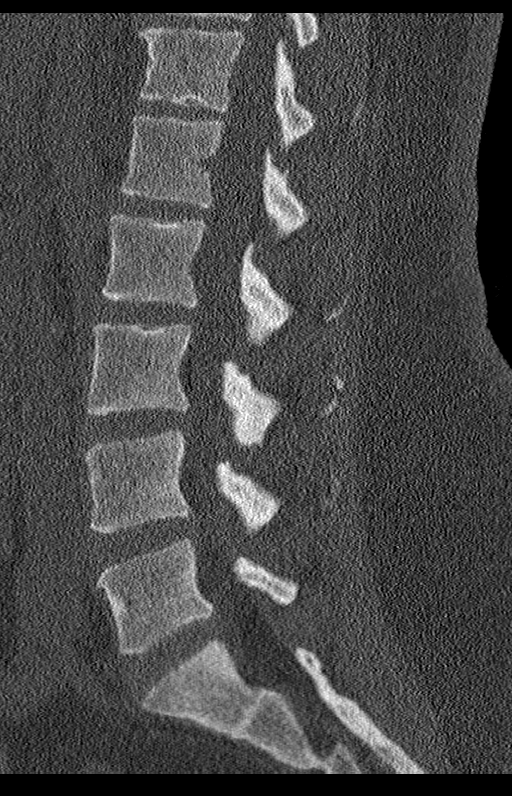
[im 50/85  bone]
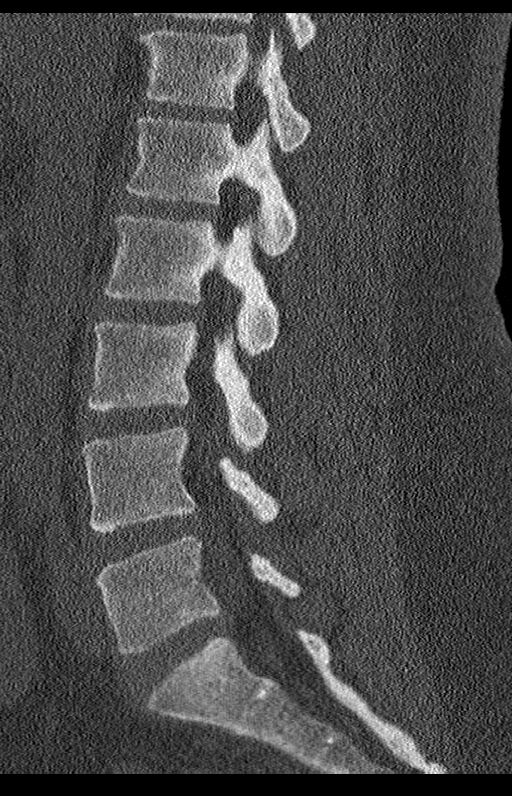
[im 57/85  bone]
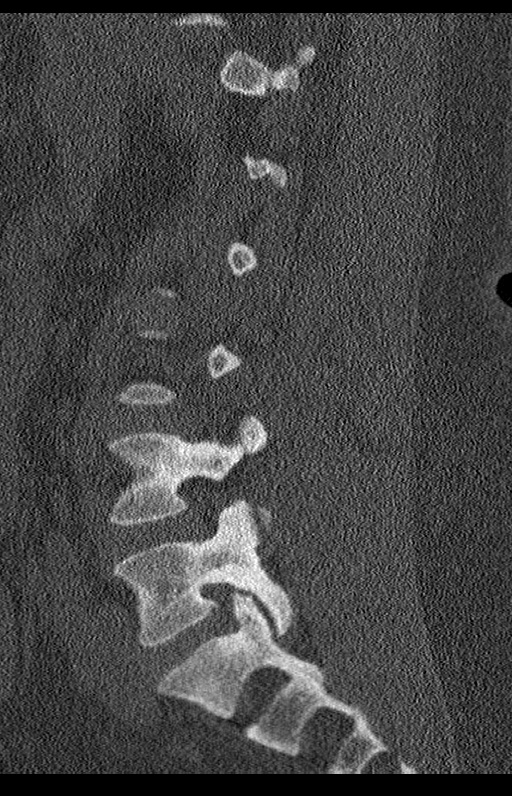

[Series 6: coronal bone · coronal · 0.30mm/px · 3 of 66 slices shown]
[im 14/66  bone]
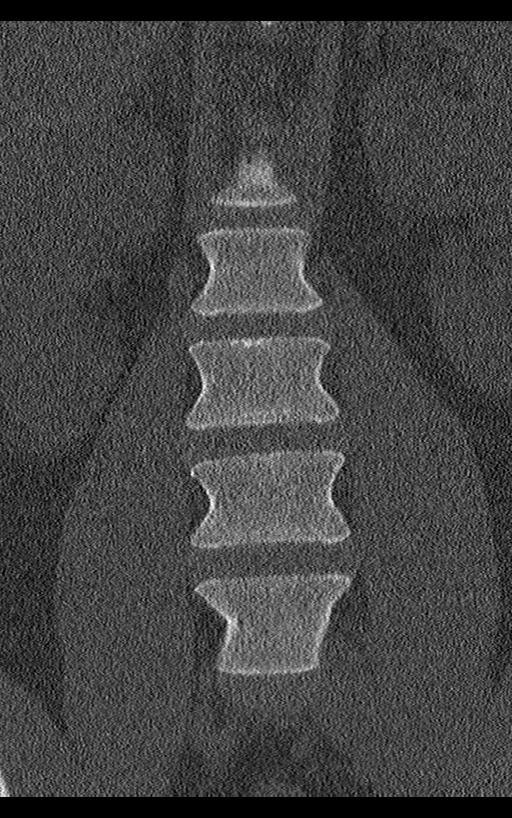
[im 27/66  bone]
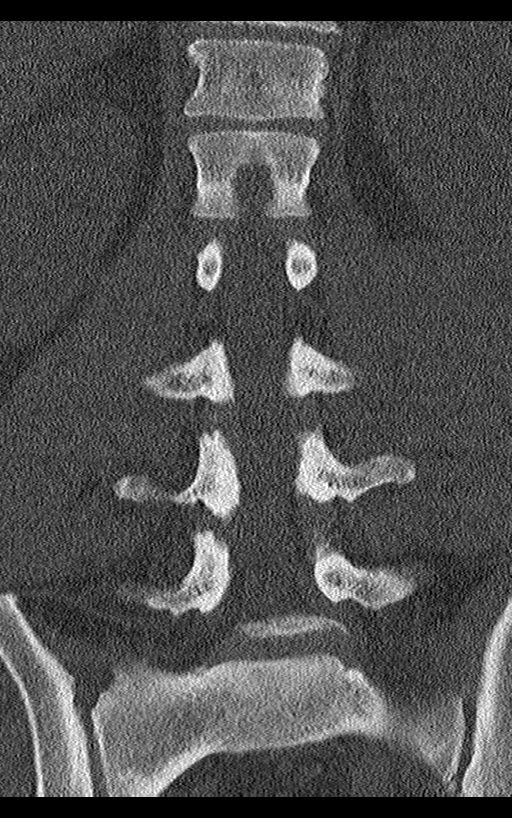
[im 40/66  bone]
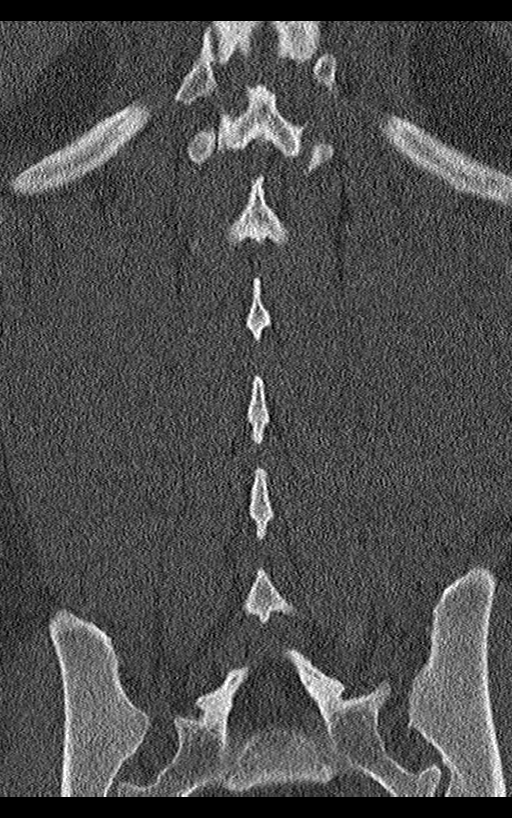

[12 of 33 positions shown; findings below may reference images not displayed]

FINDINGS: Segmentation: Normal.

Alignment: Subtle retrolisthesis of L5 on S1. Mild straightening of
lumbar lordosis. Subtle dextroconvex upper lumbar curvature.

Vertebrae: No acute osseous abnormality identified. Intact visible
sacrum and SI joints.

Paraspinal and other soft tissues: Negative visible noncontrast
abdominal viscera. Normal appendix is partially visible. Negative
visualized posterior paraspinal soft tissues.

Disc levels: T11-T12: Mild disc bulge and endplate spurring. No
stenosis.

T12-L1:  Negative.

L1-L2:  Negative.

L2-L3:  Minimal disc bulge and facet hypertrophy.  No stenosis.

L3-L4: Broad-based left foraminal disc protrusion (series 4, image
62). Mild endplate spurring. Borderline to mild facet hypertrophy.
Mild to moderate left L3 neural foraminal stenosis suspected.

L4-L5: Mild circumferential disc bulge and endplate spurring. Mild
facet hypertrophy. Mild epidural lipomatosis. Borderline to mild
bilateral L4 foraminal stenosis.

L5-S1: Mild disc bulge and endplate spurring. Mild epidural
lipomatosis. Mild facet hypertrophy. Mild to moderate left and mild
right L5 foraminal stenosis.
IMPRESSION: 1.  No acute osseous abnormality in the lumbar spine.
2. Small left foraminal disc protrusion at L3-L4. Query left L3
radiculitis.
3. Multifactorial mild to moderate L5-S1 foraminal stenosis greater
on the left.

## 2019-12-01 ENCOUNTER — Other Ambulatory Visit: Payer: Self-pay

## 2019-12-01 DIAGNOSIS — Z20822 Contact with and (suspected) exposure to covid-19: Secondary | ICD-10-CM

## 2019-12-02 LAB — NOVEL CORONAVIRUS, NAA: SARS-CoV-2, NAA: NOT DETECTED

## 2020-02-13 ENCOUNTER — Emergency Department (HOSPITAL_COMMUNITY)
Admission: EM | Admit: 2020-02-13 | Discharge: 2020-02-14 | Disposition: A | Discharge status: Home / Self Care | Payer: Self-pay | Attending: Emergency Medicine | Admitting: Emergency Medicine

## 2020-02-13 ENCOUNTER — Other Ambulatory Visit: Payer: Self-pay

## 2020-02-13 DIAGNOSIS — M545 Low back pain, unspecified: Secondary | ICD-10-CM

## 2020-02-13 DIAGNOSIS — R200 Anesthesia of skin: Secondary | ICD-10-CM | POA: Insufficient documentation

## 2020-02-13 DIAGNOSIS — F17210 Nicotine dependence, cigarettes, uncomplicated: Secondary | ICD-10-CM | POA: Insufficient documentation

## 2020-02-13 DIAGNOSIS — Z79899 Other long term (current) drug therapy: Secondary | ICD-10-CM | POA: Insufficient documentation

## 2020-02-14 ENCOUNTER — Other Ambulatory Visit: Payer: Self-pay

## 2020-02-14 ENCOUNTER — Emergency Department (HOSPITAL_COMMUNITY): Payer: Self-pay

## 2020-02-14 ENCOUNTER — Encounter (HOSPITAL_COMMUNITY): Payer: Self-pay | Admitting: Emergency Medicine

## 2020-02-14 LAB — CBC WITH DIFFERENTIAL/PLATELET
Abs Immature Granulocytes: 0.05 10*3/uL (ref 0.00–0.07)
Basophils Absolute: 0.1 10*3/uL (ref 0.0–0.1)
Basophils Relative: 1 %
Eosinophils Absolute: 0.1 10*3/uL (ref 0.0–0.5)
Eosinophils Relative: 1 %
HCT: 41.6 % (ref 36.0–46.0)
Hemoglobin: 13.4 g/dL (ref 12.0–15.0)
Immature Granulocytes: 1 %
Lymphocytes Relative: 42 %
Lymphs Abs: 4.1 10*3/uL — ABNORMAL HIGH (ref 0.7–4.0)
MCH: 28.1 pg (ref 26.0–34.0)
MCHC: 32.2 g/dL (ref 30.0–36.0)
MCV: 87.2 fL (ref 80.0–100.0)
Monocytes Absolute: 0.5 10*3/uL (ref 0.1–1.0)
Monocytes Relative: 6 %
Neutro Abs: 5 10*3/uL (ref 1.7–7.7)
Neutrophils Relative %: 49 %
Platelets: 204 10*3/uL (ref 150–400)
RBC: 4.77 MIL/uL (ref 3.87–5.11)
RDW: 14.6 % (ref 11.5–15.5)
WBC: 9.8 10*3/uL (ref 4.0–10.5)
nRBC: 0 % (ref 0.0–0.2)

## 2020-02-14 LAB — COMPREHENSIVE METABOLIC PANEL
ALT: 26 U/L (ref 0–44)
AST: 25 U/L (ref 15–41)
Albumin: 3.6 g/dL (ref 3.5–5.0)
Alkaline Phosphatase: 70 U/L (ref 38–126)
Anion gap: 8 (ref 5–15)
BUN: 9 mg/dL (ref 6–20)
CO2: 25 mmol/L (ref 22–32)
Calcium: 9.3 mg/dL (ref 8.9–10.3)
Chloride: 103 mmol/L (ref 98–111)
Creatinine, Ser: 0.65 mg/dL (ref 0.44–1.00)
GFR calc Af Amer: >60 mL/min (ref >60)
GFR calc non Af Amer: >60 mL/min (ref >60)
Glucose, Bld: 242 mg/dL — ABNORMAL HIGH (ref 70–99)
Potassium: 4 mmol/L (ref 3.5–5.1)
Sodium: 136 mmol/L (ref 135–145)
Total Bilirubin: 0.5 mg/dL (ref 0.3–1.2)
Total Protein: 6.8 g/dL (ref 6.5–8.1)

## 2020-02-14 LAB — I-STAT BETA HCG BLOOD, ED (MC, WL, AP ONLY): I-stat hCG, quantitative: <5 m[IU]/mL (ref <5)

## 2020-02-14 MED ORDER — GADOBUTROL 1 MMOL/ML IV SOLN
10.0000 mL | Freq: Once | INTRAVENOUS | Status: AC | PRN
  Administered 2020-02-14: 10 mL via INTRAVENOUS

## 2020-02-14 NOTE — ED Notes (Signed)
Pt just finished tele neuro consult.

## 2020-02-14 NOTE — ED Provider Notes (Signed)
[facility] EMERGENCY DEPARTMENT Provider Note   CSN: [id number] Arrival date & time: 02/13/20  2354     History Chief Complaint  Patient presents with  . Numbness    Brittney Caldwell is a 39 y.o. female.  Patient here with "numbness and heaviness" in her bilateral lower extremities.  States that symptoms started with numbness in her right great toe on March 24.  The numbness progressed to involve her entire right foot and right leg about 5 days ago.  She also noticed progressive numbness from her foot on the left side radiating up into her knee.  She feels numbness bilaterally as well as heaviness.  She denies pins-and-needles sensation but states it does not feel quite right.  She denies any focal weakness but states that her legs feel heavy.  She denies any pain with urination or blood in the urine.  Does have some burning across her entire low back.  Denies any numbness to her genitals.  No bowel or bladder incontinence.  No fever or vomiting.  No previous neck or back issues.  No recent vaccines or injections.  The history is provided by the patient.       History reviewed. No pertinent past medical history.  There are no problems to display for this patient.   History reviewed. No pertinent surgical history.   OB History    Gravida  1   Para  1   Term      Preterm  1   AB      Living  1     SAB      TAB      Ectopic      Multiple      Live Births              Family History  Problem Relation Age of Onset  . Diabetes Paternal Grandfather   . Diabetes Maternal Grandmother   . Hypertension Father   . Hyperlipidemia Father   . Asthma Son   . Hypertension Mother   . Hyperlipidemia Mother     Social History   Tobacco Use  . Smoking status: Current Every Day Smoker    Packs/day: 0.25    Years: 15.00    Pack years: 3.75    Types: Cigarettes  . Smokeless tobacco: Never Used  Substance Use Topics  . Alcohol use: Yes    Alcohol/week: 0.0  standard drinks    Comment: occasional  . Drug use: Yes    Types: Marijuana    Comment: 2 weeks ago    Home Medications Prior to Admission medications   Medication Sig Start Date End Date Taking? Authorizing Provider  guaiFENesin-codeine 100-10 MG/5ML syrup Take 10 mLs by mouth 3 (three) times daily as needed for cough. 08/21/18   Terrilee Files, MD  ibuprofen (ADVIL,MOTRIN) 200 MG tablet Take 400 mg by mouth every 6 (six) hours as needed.    [provider]  megestrol (MEGACE) 40 MG tablet 3 tablets a day for 5 days, 2 tablets a day for 5 days then 1 tablet daily 06/03/18   Lazaro Arms, MD    Allergies    Penicillins  Review of Systems   Review of Systems  Constitutional: Negative for activity change, appetite change, fatigue and fever.  HENT: Negative for congestion and rhinorrhea.   Eyes: Negative for visual disturbance.  Respiratory: Negative for cough, chest tightness and shortness of breath.   Cardiovascular: Negative for chest pain.  Gastrointestinal:  Negative for abdominal pain, nausea and vomiting.  Genitourinary: Negative for dysuria and hematuria.  Musculoskeletal: Positive for back pain. Negative for arthralgias and myalgias.  Skin: Negative for rash and wound.  Neurological: Positive for weakness and numbness. Negative for dizziness and headaches.   all other systems are negative except as noted in the HPI and PMH.    Physical Exam Updated Vital Signs BP (!) 168/103   Pulse 90   Temp 98.5 F (36.9 C) (Oral)   Resp 19   Ht 5' 4.5" (1.638 m)   Wt 102.1 kg   LMP 01/31/2020   SpO2 100%   BMI 38.02 kg/m   Physical Exam Vitals and nursing note reviewed.  Constitutional:      General: She is not in acute distress.    Appearance: She is well-developed. She is obese.  HENT:     Head: Normocephalic and atraumatic.     Mouth/Throat:     Pharynx: No oropharyngeal exudate.  Eyes:     Conjunctiva/sclera: Conjunctivae normal.     Pupils: Pupils are  equal, round, and reactive to light.  Neck:     Comments: No meningismus. Cardiovascular:     Rate and Rhythm: Normal rate and regular rhythm.     Heart sounds: Normal heart sounds. No murmur.  Pulmonary:     Effort: Pulmonary effort is normal. No respiratory distress.     Breath sounds: Normal breath sounds.  Abdominal:     Palpations: Abdomen is soft.     Tenderness: There is no abdominal tenderness. There is no guarding or rebound.  Musculoskeletal:        General: Tenderness present. Normal range of motion.     Cervical back: Normal range of motion and neck supple.     Comments: Paraspinal lumbar tenderness bilateral  5/5 strength in bilateral lower extremities. Ankle plantar and dorsiflexion intact. Great toe extension intact bilaterally. +2 DP and PT pulses. +2 patellar reflexes bilaterally. Normal gait. Subjective numbness to entirety of right leg and left lower leg Sensation is equal bilaterally.   Skin:    General: Skin is warm.  Neurological:     Mental Status: She is alert and oriented to person, place, and time.     Cranial Nerves: No cranial nerve deficit.     Motor: No abnormal muscle tone.     Coordination: Coordination normal.     Comments:  5/5 strength throughout. CN 2-12 intact.Equal grip strength.   Psychiatric:        Behavior: Behavior normal.     ED Results / Procedures / Treatments   Labs (all labs ordered are listed, but only abnormal results are displayed) Labs Reviewed  CBC WITH DIFFERENTIAL/PLATELET - Abnormal; Notable for the following components:      Result Value   Lymphs Abs 4.1 (*)    All other components within normal limits  COMPREHENSIVE METABOLIC PANEL - Abnormal; Notable for the following components:   Glucose, Bld 242 (*)    All other components within normal limits  URINALYSIS, ROUTINE W REFLEX MICROSCOPIC  RAPID URINE DRUG SCREEN, HOSP PERFORMED  I-STAT BETA HCG BLOOD, ED ([facility], [facility], [facility] ONLY)    EKG None  Radiology No results  found.  Procedures Procedures (including critical care time)  Medications Ordered in ED Medications - No data to display  ED Course  I have reviewed the triage vital signs and the nursing notes.  Pertinent labs & imaging results that were available during my care of the  patient were reviewed by me and considered in my medical decision making (see chart for details).    MDM Rules/Calculators/A&P                      Ascending bilateral lower extremity numbness.  No focal weakness on exam.  Intact distal pulses.  No fever, chills, bowel or bladder incontinence.  Reflexes intact so Guillain Barr syndrome seems less likely.  Recommend transfer for MRI of thoracic and lumbar spine.  Patient hesitant to agree for emergent transfer.  Symptoms have been going on for almost a week.  Consider spine pathology including cord compression or cauda equina.  Consider multiple sclerosis or other demyelinating disease. Basic labs show hyperglycemia with no history of diabetes.  Teleneurology will be asked to consult to discuss risks and benefits of emergent transfer.  Consult with teleneurologist Dr. Dale Lewes.  He agrees there is concern for cauda equina or cord compression and recommends emergent transfer for MRI lumbar spine with and without contrast.  He feels structural lesion needs to be ruled out emergently.  Agrees that Sharlyn Bologna seems less likely with asymmetry and intact strength and reflexes.  Patient agreeable to transfer to [facility] directly by private vehicle. D/w Dr. Dayna Barker.  Patient advised not to eat or drink anything on the way. Final Clinical Impression(s) / ED Diagnoses Final diagnoses:  None    Rx / DC Orders ED Discharge Orders    None       Fate Galanti, Annie Main, MD 02/14/20 [phone]

## 2020-02-14 NOTE — ED Triage Notes (Signed)
Pt C/O bilateral foot and leg numbness that runs to her lower back. Denies injury. Pt states this started Wednesday after taking a shower she noticed her right big toe was numb.

## 2020-02-14 NOTE — Consult Note (Signed)
[facility] Tele[facility]urology Consult Services  STAT Neurology Consult   Requesting Provider: Dr. Wyvonnia Dusky  Chief Complaint: Back pain with gait abnormality and lower extremity weakness and numbness  HPI: Asked to see this patient in telemedicine consultation utilizing interactive audio and video technologies. ?Consultation was performed with assistance of ancillary / medical staff at bedside. ? Verbal consent to perform the examination with telemedicine was obtained. Patient agreed to proceed with the consultation.  39 year old left-handed African-American female who comes to the emergency room tonight for severe back pain and right buttock pain as well as persistent worsening lower extremity weakness and numbness.  Patient does not take any blood thinners or aspirin.  She denies any past medical history.  She denies any Covid symptoms this year.  She denies any recent vaccinations over the last month.  Patient states that on Wednesday, she noted an odd numbness at her right big toe as she was coming out of the shower.  She denied any recent back trauma.  She noted by Thursday, the numbness had extended up her entire right leg into her back and right buttock.  By the weekend, she also started noticing left foot numbness that only went to her left knee.  Eventually, she developed back pain.  Yesterday, she developed a 10/10 back pain across her low back.  She also developed a severe burning pain over her right lateral buttock.  She also notes she had trouble walking and was stumbling.  She has never had symptoms like this before.  She was uncomfortable due to the pain and could not sleep last night.  No associated bowel or bladder incontinence.  No saddle or perianal numbness noted by the patient.  She had tried icy hot and heat without relief.  She states that Tylenol arthritis may be providing some relief of her numbness.  However, she still reports significant low back pain and right buttock burning  pain.  In the ER, the ER attending noted intact reflexes.  Patient was able to lift up both her legs off the bed, with some mild right leg drifting and weakness.  No involvement of her face or arms.  PMH: Denies.  SOC: Positive for tobacco abuse, marijuana use, and occasional alcohol use.  She lives with her son.  [place]: Significant for hypertension diabetes mellitus.  ROS: 13 point review of systems were reviewed with the patient, and are all negative with the exception of the aforementioned in the history of present illness.  VS: Temperature 98.5 F, pulse 90, respiration 19, blood pressure 168/103, oxygen saturation 100%  Exam: Patient is in no apparent distress.  Patient appears as stated age.  No obvious acute respiratory or cardiac distress.  Patient is well groomed and well-nourished. Patient is alert and oriented to person, place, and time. Speech is fluent. Language is intact. Attention and concentration are intact. Recent and remote memories are intact. Mood and affect are appropriate.  Cranial nerves II through XII are intact.  Both legs are anti-gravity.  Right leg appears 4/5 and left leg appears 5/5. Patient with right buttock area burning paresthesias and right leg numbness. No limb incoordination or abnormal movements.   Diagnostic Data: WBC 9.8, hemoglobin 13.8, platelets 204  Last head CT on 02/04/2018 was normal.  Medical Data Reviewed:?   1.Data?reviewed include clinical labs, radiology,?and medical tests;??   2.Tests?results discussed w/performing or interpreting physician;??   3.Obtaining/reviewing old medical records;?   4.Obtaining?case history from another source;?   5.Independent?review of image, tracing, or specimen.?  Medical Decision Making:?   - Extensive number of diagnosis or management options are considered below.??   - Extensive amount of complex data reviewed.??   - High risk of complication and/or morbidity or mortality are associated with  differential diagnostic considerations below.?   - There may be?uncertain?outcome and increased probability of prolonged functional impairment or high probability of severe prolonged functional impairment associated with some of these differential diagnosis.?   Assessment: 1.  Back pain with lower extremity weakness and numbness/paresthesias.  Rule out Cauda Equina Syndrome. 2.  Tobacco abuse  Recommendations: Patient should obtain a stat MRI lumbar spine with and without contrast to rule out cauda equina syndrome or significant compressive lesion within the lumbar spine.  Patient will likely need to be transferred to another facility that can offer emergent MRI imaging tonight. Presence of reflexes and asymmetry of her leg symptoms make GBS less likely. To consider a formal CT of the abdomen and pelvis with and without contrast to rule out any pelvic mass lesion that could compress the lumbosacral plexus. Consult inpatient neurology team to assist with evaluation and management. Could also consider MRI cervical and thoracic spines with and without contrast if MRI lumbar spine imaging is negative. Consult PT as well. Continue supportive care Plan of care was discussed with the patient  Thank you for allowing [facility] to participate in the care of your patient. Please call me, Dr. Adrienne Mocha, with any questions at [phone].

## 2020-02-14 NOTE — ED Notes (Signed)
Patient left without being seen by  A doctor

## 2022-12-14 ENCOUNTER — Other Ambulatory Visit: Payer: Self-pay

## 2022-12-14 ENCOUNTER — Encounter (HOSPITAL_COMMUNITY): Payer: Self-pay | Admitting: Emergency Medicine

## 2022-12-14 ENCOUNTER — Emergency Department (HOSPITAL_COMMUNITY)
Admission: EM | Admit: 2022-12-14 | Discharge: 2022-12-14 | Disposition: A | Discharge status: Home / Self Care | Payer: Medicaid Other | Attending: Emergency Medicine | Admitting: Emergency Medicine

## 2022-12-14 DIAGNOSIS — N920 Excessive and frequent menstruation with regular cycle: Secondary | ICD-10-CM | POA: Diagnosis not present

## 2022-12-14 DIAGNOSIS — Z7984 Long term (current) use of oral hypoglycemic drugs: Secondary | ICD-10-CM | POA: Insufficient documentation

## 2022-12-14 DIAGNOSIS — E119 Type 2 diabetes mellitus without complications: Secondary | ICD-10-CM | POA: Diagnosis not present

## 2022-12-14 DIAGNOSIS — N921 Excessive and frequent menstruation with irregular cycle: Secondary | ICD-10-CM

## 2022-12-14 DIAGNOSIS — N939 Abnormal uterine and vaginal bleeding, unspecified: Secondary | ICD-10-CM | POA: Diagnosis present

## 2022-12-14 DIAGNOSIS — E1165 Type 2 diabetes mellitus with hyperglycemia: Secondary | ICD-10-CM | POA: Diagnosis not present

## 2022-12-14 LAB — BASIC METABOLIC PANEL
Anion gap: 9 (ref 5–15)
BUN: 7 mg/dL (ref 6–20)
CO2: 21 mmol/L — ABNORMAL LOW (ref 22–32)
Calcium: 8.9 mg/dL (ref 8.9–10.3)
Chloride: 103 mmol/L (ref 98–111)
Creatinine, Ser: 0.69 mg/dL (ref 0.44–1.00)
GFR, Estimated: >60 mL/min (ref >60)
Glucose, Bld: 336 mg/dL — ABNORMAL HIGH (ref 70–99)
Potassium: 4 mmol/L (ref 3.5–5.1)
Sodium: 133 mmol/L — ABNORMAL LOW (ref 135–145)

## 2022-12-14 LAB — CBC
HCT: 41.8 % (ref 36.0–46.0)
Hemoglobin: 14.5 g/dL (ref 12.0–15.0)
MCH: 30.6 pg (ref 26.0–34.0)
MCHC: 34.7 g/dL (ref 30.0–36.0)
MCV: 88.2 fL (ref 80.0–100.0)
Platelets: 182 10*3/uL (ref 150–400)
RBC: 4.74 MIL/uL (ref 3.87–5.11)
RDW: 12.8 % (ref 11.5–15.5)
WBC: 9.1 10*3/uL (ref 4.0–10.5)
nRBC: 0 % (ref 0.0–0.2)

## 2022-12-14 MED ORDER — METFORMIN HCL 500 MG PO TABS: 500.0000 mg | ORAL_TABLET | Freq: Two times a day (BID) | ORAL | 0 refills | Status: DC

## 2022-12-14 MED ORDER — MEGESTROL ACETATE 40 MG PO TABS: ORAL_TABLET | ORAL | 3 refills | Status: DC

## 2022-12-14 NOTE — ED Triage Notes (Signed)
Pt c/o vaginal bleeding since 11/13/2022, reports she called OBGYN and cannot get an appt until Altamese Dilling, pt reports she about 7 peri-pads per day

## 2022-12-14 NOTE — Discharge Instructions (Signed)
You are being given 2 medications, 1 to help slow up your bleeding, the second to help start lowering your blood glucose level, with today's glucose level being 336, this means that you are a diabetic and need to be on medication.  You will need close follow-up with the primary doctor as we discussed, also keep your appointment with your gynecologist as well.  I am also going to be referring you for a class here at [facility] to help you better understand and manage your new diabetes diagnosis.  Expect that you will be receiving a call from the people that run this class to arrange an appointment time with them.

## 2022-12-14 NOTE — ED Provider Notes (Signed)
[facility] Provider Note   CSN: [id number] Arrival date & time: 12/14/22  5643     History {Add pertinent medical, surgical, social history, OB history to HPI:1} Chief Complaint  Patient presents with  . Vaginal Bleeding    Brittney Caldwell is a 42 y.o. female.  The history is provided by the patient.       Home Medications Prior to Admission medications   Medication Sig Start Date End Date Taking? Authorizing Provider  metFORMIN (GLUCOPHAGE) 500 MG tablet Take 1 tablet (500 mg total) by mouth 2 (two) times daily with a meal. 12/14/22  Yes Malissa Slay, Almyra Free, PA-C  guaiFENesin-codeine 100-10 MG/5ML syrup Take 10 mLs by mouth 3 (three) times daily as needed for cough. 08/21/18   Hayden Rasmussen, MD  ibuprofen (ADVIL,MOTRIN) 200 MG tablet Take 400 mg by mouth every 6 (six) hours as needed.    [provider]  megestrol (MEGACE) 40 MG tablet 3 tablets a day for 5 days, 2 tablets a day for 5 days then 1 tablet daily 12/14/22   Meklit Cotta, Almyra Free, PA-C      Allergies    Penicillins    Review of Systems   Review of Systems  Physical Exam Updated Vital Signs BP (!) 156/107   Pulse 80   Temp 98.2 F (36.8 C) (Oral)   Resp 17   Ht 5' 4.5" (1.638 m)   Wt 108.9 kg   LMP 11/13/2022 (Exact Date)   SpO2 100%   BMI 40.56 kg/m  Physical Exam  ED Results / Procedures / Treatments   Labs (all labs ordered are listed, but only abnormal results are displayed) Labs Reviewed  BASIC METABOLIC PANEL - Abnormal; Notable for the following components:      Result Value   Sodium 133 (*)    CO2 21 (*)    Glucose, Bld 336 (*)    All other components within normal limits  CBC    EKG None  Radiology No results found.  Procedures Procedures  {Document cardiac monitor, telemetry assessment procedure when appropriate:1}  Medications Ordered in ED Medications - No data to display  ED Course/ Medical Decision Making/ A&P   {   Click  here for ABCD2, HEART and other calculatorsREFRESH Note before signing :1}                          Medical Decision Making Amount and/or Complexity of Data Reviewed Labs: ordered.  Risk Prescription drug management.   ***  {Document critical care time when appropriate:1} {Document review of labs and clinical decision tools ie heart score, Chads2Vasc2 etc:1}  {Document your independent review of radiology images, and any outside records:1} {Document your discussion with family members, caretakers, and with consultants:1} {Document social determinants of health affecting pt's care:1} {Document your decision making why or why not admission, treatments were needed:1} Final Clinical Impression(s) / ED Diagnoses Final diagnoses:  Menorrhagia with irregular cycle  New onset type 2 diabetes mellitus ([place])    Rx / DC Orders ED Discharge Orders          Ordered    megestrol (MEGACE) 40 MG tablet        12/14/22 1151    metFORMIN (GLUCOPHAGE) 500 MG tablet  2 times daily with meals        12/14/22 1151    Ambulatory referral to Nutrition and Diabetic Education  12/14/22 1151            

## 2022-12-30 ENCOUNTER — Ambulatory Visit: Payer: Medicaid Other | Admitting: Family Medicine

## 2022-12-30 ENCOUNTER — Encounter: Payer: Self-pay | Admitting: Family Medicine

## 2022-12-30 VITALS — BP 136/89 | HR 112 | Ht 64.0 in | Wt 214.0 lb

## 2022-12-30 DIAGNOSIS — Z1159 Encounter for screening for other viral diseases: Secondary | ICD-10-CM

## 2022-12-30 DIAGNOSIS — Z0001 Encounter for general adult medical examination with abnormal findings: Secondary | ICD-10-CM | POA: Diagnosis not present

## 2022-12-30 DIAGNOSIS — I10 Essential (primary) hypertension: Secondary | ICD-10-CM

## 2022-12-30 DIAGNOSIS — E119 Type 2 diabetes mellitus without complications: Secondary | ICD-10-CM | POA: Diagnosis not present

## 2022-12-30 DIAGNOSIS — E039 Hypothyroidism, unspecified: Secondary | ICD-10-CM | POA: Diagnosis not present

## 2022-12-30 DIAGNOSIS — E785 Hyperlipidemia, unspecified: Secondary | ICD-10-CM | POA: Diagnosis not present

## 2022-12-30 DIAGNOSIS — R7301 Impaired fasting glucose: Secondary | ICD-10-CM

## 2022-12-30 NOTE — Assessment & Plan Note (Signed)
Physical exam done, labs ordered  Updated screening and health maintenance  Exercise and nutrition counseling BMI  36.73 Explain to follow a DASH diet which includes vegetables,fruits,whole grains, fat free or low fat diary,fish,poultry,beans,nuts and seeds,vegetable oils. Find an activity that you will enjoy and start to be active at least 5 days a week for 30 minutes each day.  Follow up in 2 weeks with blood pressure log due to history of elevated blood pressure readings Referral to ophthalmology placed for diabetic eye exam, Foot exam within desired limits

## 2022-12-30 NOTE — Patient Instructions (Addendum)
It was pleasure meeting with you today. Please take medications as prescribed. Follow up with your primary health provider if any health concerns arises. If symptoms worsen please contact your primary care provider and/or visit the emergency department. Return next week fasting for blood work Follow up 2 weeks with blood pressure reading log. Follow up 3 months for diabetes management

## 2022-12-30 NOTE — Progress Notes (Signed)
Complete physical exam  Patient: Brittney Caldwell   DOB: 1981-05-11   42 y.o. Female  MRN: [id number]  Subjective:    Chief Complaint  Patient presents with   [place] is a 42 y.o. female who presents today for a complete physical exam. She reports consuming a  High protein low carb diet  diet.  Patient reports she walks daily  She generally feels well. She reports sleeping fair. She does not have additional problems to discuss today.    Most recent fall risk assessment:    12/30/2022   10:22 AM  [place] in the past year? 0  Number falls in past yr: 0  Injury with Fall? 0  Risk for fall due to : No Fall Risks  Follow up Falls evaluation completed     Most recent depression screenings:    12/30/2022   10:22 AM 05/03/2018    8:44 AM  PHQ 2/9 Scores  PHQ - 2 Score 2 0  PHQ- 9 Score 4     Vision:Not within last year   Patient Care Team: Del Ria Comment, Lamar Benes, FNP as PCP - General (Family Medicine)   Outpatient Medications Prior to Visit  Medication Sig   megestrol (MEGACE) 40 MG tablet 3 tablets a day for 5 days, 2 tablets a day for 5 days then 1 tablet daily   metFORMIN (GLUCOPHAGE) 500 MG tablet Take 1 tablet (500 mg total) by mouth 2 (two) times daily with a meal.   guaiFENesin-codeine 100-10 MG/5ML syrup Take 10 mLs by mouth 3 (three) times daily as needed for cough.   ibuprofen (ADVIL,MOTRIN) 200 MG tablet Take 400 mg by mouth every 6 (six) hours as needed.   No facility-administered medications prior to visit.    Review of Systems  Constitutional:  Negative for chills and fever.  HENT:  Negative for ear pain and tinnitus.   Eyes:  Negative for blurred vision.  Respiratory:  Negative for cough and shortness of breath.   Cardiovascular:  Negative for palpitations.  Gastrointestinal:  Positive for heartburn. Negative for abdominal pain, nausea and vomiting.  Genitourinary:  Negative for dysuria.  Musculoskeletal:  Negative for  myalgias.  Skin:  Negative for rash.  Neurological:  Negative for dizziness and headaches.  Psychiatric/Behavioral:  The patient is nervous/anxious.        Objective:    BP 136/89   Pulse (!) 112   Ht 5' 4"$  (1.626 m)   Wt 214 lb (97.1 kg)   LMP 11/13/2022 (Exact Date)   SpO2 100%   BMI 36.73 kg/m  BP Readings from Last 3 Encounters:  12/30/22 136/89  12/14/22 (!) 156/107  02/14/20 (!) 164/98      Physical Exam HENT:     Head: Normocephalic.     Right Ear: Tympanic membrane normal.     Left Ear: Tympanic membrane normal.     Nose: Nose normal. No congestion.     Mouth/Throat:     Mouth: Mucous membranes are moist.  Eyes:     Extraocular Movements: Extraocular movements intact.     Pupils: Pupils are equal, round, and reactive to light.  Cardiovascular:     Rate and Rhythm: Normal rate.     Pulses: Normal pulses.     Heart sounds: No murmur heard. Pulmonary:     Effort: Pulmonary effort is normal. No respiratory distress.     Breath sounds: Normal breath sounds.  Abdominal:  General: Bowel sounds are normal. There is no distension.     Palpations: Abdomen is soft. There is no mass.  Musculoskeletal:        General: No swelling. Normal range of motion.     Cervical back: Normal range of motion and neck supple.     Right lower leg: No edema.     Left lower leg: No edema.  Skin:    General: Skin is warm and dry.     Capillary Refill: Capillary refill takes less than 2 seconds.  Neurological:     General: No focal deficit present.     Mental Status: She is alert and oriented to person, place, and time.     Motor: No weakness.     Coordination: Coordination normal.     Gait: Gait normal.  Psychiatric:        Mood and Affect: Mood normal.        Thought Content: Thought content normal.      No results found for any visits on 12/30/22.    Assessment & Plan:    Routine Health Maintenance and Physical Exam  Immunization History  Administered Date(s)  Administered   Moderna SARS-COV2 Booster Vaccination 03/15/2020   Tdap 12/21/2018    Health Maintenance  Topic Date Due   Diabetic kidney evaluation - Urine ACR  Never done   Hepatitis C Screening  Never done   COVID-19 Vaccine (2 - 2023-24 season) 01/15/2023 (Originally 07/17/2022)   INFLUENZA VACCINE  02/14/2023 (Originally 06/16/2022)   Diabetic kidney evaluation - eGFR measurement  12/15/2023   PAP SMEAR-Modifier  12/14/2025   DTaP/Tdap/Td (2 - Td or Tdap) 12/21/2028   HIV Screening  Completed   HPV VACCINES  Aged Out    Discussed health benefits of physical activity, and encouraged her to engage in regular exercise appropriate for her age and condition.  IFG (impaired fasting glucose) -     Hemoglobin A1c  Primary hypertension -     Microalbumin / creatinine urine ratio -     CMP14+EGFR  Need for hepatitis C screening test -     Hepatitis C antibody  Hypothyroidism, unspecified type -     TSH + free T4  Hyperlipidemia, unspecified hyperlipidemia type -     Lipid panel -     CBC with Differential/Platelet  Encounter for diabetes type 2 eye exam ([place]) -     Ambulatory referral to Ophthalmology  Encounter for routine adult physical exam with abnormal findings Assessment & Plan: Physical exam done, labs ordered  Updated screening and health maintenance  Exercise and nutrition counseling BMI  36.73 Explain to follow a DASH diet which includes vegetables,fruits,whole grains, fat free or low fat diary,fish,poultry,beans,nuts and seeds,vegetable oils. Find an activity that you will enjoy and start to be active at least 5 days a week for 30 minutes each day.  Follow up in 2 weeks with blood pressure log due to history of elevated blood pressure readings Referral to ophthalmology placed for diabetic eye exam, Foot exam within desired limits     Return in about 3 months (around 03/30/2023) for diabetes.     Renard Hamper Ria Comment, FNP

## 2023-01-04 DIAGNOSIS — R7301 Impaired fasting glucose: Secondary | ICD-10-CM | POA: Diagnosis not present

## 2023-01-04 DIAGNOSIS — E785 Hyperlipidemia, unspecified: Secondary | ICD-10-CM | POA: Diagnosis not present

## 2023-01-04 DIAGNOSIS — Z1159 Encounter for screening for other viral diseases: Secondary | ICD-10-CM | POA: Diagnosis not present

## 2023-01-04 DIAGNOSIS — I10 Essential (primary) hypertension: Secondary | ICD-10-CM | POA: Diagnosis not present

## 2023-01-04 DIAGNOSIS — E039 Hypothyroidism, unspecified: Secondary | ICD-10-CM | POA: Diagnosis not present

## 2023-01-06 ENCOUNTER — Other Ambulatory Visit: Payer: Self-pay | Admitting: Family Medicine

## 2023-01-06 DIAGNOSIS — E785 Hyperlipidemia, unspecified: Secondary | ICD-10-CM

## 2023-01-06 DIAGNOSIS — E1165 Type 2 diabetes mellitus with hyperglycemia: Secondary | ICD-10-CM

## 2023-01-06 LAB — CBC WITH DIFFERENTIAL/PLATELET
Basophils Absolute: 0 10*3/uL (ref 0.0–0.2)
Basos: 0 %
EOS (ABSOLUTE): 0.1 10*3/uL (ref 0.0–0.4)
Eos: 1 %
Hematocrit: 44.2 % (ref 34.0–46.6)
Hemoglobin: 15.2 g/dL (ref 11.1–15.9)
Immature Grans (Abs): 0 10*3/uL (ref 0.0–0.1)
Immature Granulocytes: 0 %
Lymphocytes Absolute: 3.3 10*3/uL — ABNORMAL HIGH (ref 0.7–3.1)
Lymphs: 31 %
MCH: 30 pg (ref 26.6–33.0)
MCHC: 34.4 g/dL (ref 31.5–35.7)
MCV: 87 fL (ref 79–97)
Monocytes Absolute: 0.4 10*3/uL (ref 0.1–0.9)
Monocytes: 4 %
Neutrophils Absolute: 6.8 10*3/uL (ref 1.4–7.0)
Neutrophils: 64 %
Platelets: 214 10*3/uL (ref 150–450)
RBC: 5.07 x10E6/uL (ref 3.77–5.28)
RDW: 12.9 % (ref 11.7–15.4)
WBC: 10.7 10*3/uL (ref 3.4–10.8)

## 2023-01-06 LAB — CMP14+EGFR
ALT: 14 IU/L (ref 0–32)
AST: 13 IU/L (ref 0–40)
Albumin/Globulin Ratio: 1.6 (ref 1.2–2.2)
Albumin: 4.2 g/dL (ref 3.9–4.9)
Alkaline Phosphatase: 68 IU/L (ref 44–121)
BUN/Creatinine Ratio: 10 (ref 9–23)
BUN: 9 mg/dL (ref 6–24)
Bilirubin Total: 0.3 mg/dL (ref 0.0–1.2)
CO2: 15 mmol/L — ABNORMAL LOW (ref 20–29)
Calcium: 9.8 mg/dL (ref 8.7–10.2)
Chloride: 106 mmol/L (ref 96–106)
Creatinine, Ser: 0.93 mg/dL (ref 0.57–1.00)
Globulin, Total: 2.7 g/dL (ref 1.5–4.5)
Glucose: 250 mg/dL — ABNORMAL HIGH (ref 70–99)
Potassium: 4.4 mmol/L (ref 3.5–5.2)
Sodium: 137 mmol/L (ref 134–144)
Total Protein: 6.9 g/dL (ref 6.0–8.5)
eGFR: 80 mL/min/{1.73_m2} (ref >59)

## 2023-01-06 LAB — LIPID PANEL
Chol/HDL Ratio: 6.9 ratio — ABNORMAL HIGH (ref 0.0–4.4)
Cholesterol, Total: 186 mg/dL (ref 100–199)
HDL: 27 mg/dL — ABNORMAL LOW (ref >39)
LDL Chol Calc (NIH): 124 mg/dL — ABNORMAL HIGH (ref 0–99)
Triglycerides: 195 mg/dL — ABNORMAL HIGH (ref 0–149)
VLDL Cholesterol Cal: 35 mg/dL (ref 5–40)

## 2023-01-06 LAB — HEPATITIS C ANTIBODY: Hep C Virus Ab: NONREACTIVE

## 2023-01-06 LAB — TSH+FREE T4
Free T4: 1.33 ng/dL (ref 0.82–1.77)
TSH: 1.09 u[IU]/mL (ref 0.450–4.500)

## 2023-01-06 LAB — MICROALBUMIN / CREATININE URINE RATIO
Creatinine, Urine: 216.2 mg/dL
Microalb/Creat Ratio: 7 mg/g creat (ref 0–29)
Microalbumin, Urine: 14.5 ug/mL

## 2023-01-06 LAB — HEMOGLOBIN A1C
Est. average glucose Bld gHb Est-mCnc: 272 mg/dL
Hgb A1c MFr Bld: 11.1 % — ABNORMAL HIGH (ref 4.8–5.6)

## 2023-01-06 MED ORDER — LANCETS MISC. MISC: 1.0000 | Freq: Three times a day (TID) | 0 refills | Status: AC

## 2023-01-06 MED ORDER — TRULICITY 0.75 MG/0.5ML ~~LOC~~ SOAJ: 0.7500 mg | SUBCUTANEOUS | 2 refills | Status: DC

## 2023-01-06 MED ORDER — BLOOD GLUCOSE TEST VI STRP: 1.0000 | ORAL_STRIP | Freq: Three times a day (TID) | 0 refills | Status: AC

## 2023-01-06 MED ORDER — GLIMEPIRIDE 1 MG PO TABS: 1.0000 mg | ORAL_TABLET | Freq: Every day | ORAL | 3 refills | Status: DC

## 2023-01-06 MED ORDER — ROSUVASTATIN CALCIUM 5 MG PO TABS: 5.0000 mg | ORAL_TABLET | Freq: Every day | ORAL | 3 refills | Status: DC

## 2023-01-06 MED ORDER — METFORMIN HCL 1000 MG PO TABS: 1000.0000 mg | ORAL_TABLET | Freq: Two times a day (BID) | ORAL | 3 refills | Status: DC

## 2023-01-06 MED ORDER — BLOOD GLUCOSE MONITORING SUPPL DEVI: 1.0000 | Freq: Three times a day (TID) | 0 refills | Status: DC

## 2023-01-06 MED ORDER — LANCET DEVICE MISC: 1.0000 | Freq: Three times a day (TID) | 0 refills | Status: AC

## 2023-01-13 ENCOUNTER — Encounter: Payer: Self-pay | Admitting: Family Medicine

## 2023-01-13 ENCOUNTER — Ambulatory Visit: Payer: Medicaid Other | Admitting: Family Medicine

## 2023-01-13 VITALS — BP 112/78

## 2023-01-13 DIAGNOSIS — R7301 Impaired fasting glucose: Secondary | ICD-10-CM | POA: Diagnosis not present

## 2023-01-13 DIAGNOSIS — I10 Essential (primary) hypertension: Secondary | ICD-10-CM | POA: Diagnosis not present

## 2023-01-13 DIAGNOSIS — E782 Mixed hyperlipidemia: Secondary | ICD-10-CM | POA: Diagnosis not present

## 2023-01-13 NOTE — Patient Instructions (Addendum)
It was pleasure meeting with you today. Please take medications as prescribed. Follow up with your primary health provider if any health concerns arises. Labs 5 days before our next visit fasting

## 2023-01-13 NOTE — Assessment & Plan Note (Addendum)
Vitals:   01/13/23 1049  BP: 112/78  At home blood pressure readings 131/82, 121/75, 118/73, 120/71, 122/73, 132/76 Blood pressure well controlled.  Discussed to continue non pharmacological interventions follow diet low in saturated fat, reduce dietary salt intake, avoid fatty foods, maintain an exercise routine 3 to 5 days a week for a minimum total of 150 minutes. Patient verbalizes understanding regarding plan of care and all questions answered.  No medication intervention in today's visit

## 2023-01-13 NOTE — Progress Notes (Signed)
Patient Office Visit   Subjective   Patient ID: ARLETHA TREVITHICK, female    DOB: 12/14/1980  Age: 42 y.o. MRN: [id number]  CC:  Chief Complaint  Patient presents with   Hypertension    Patient is here for HTN f/u. Has been keeping a log at home, has been stable.     HPI MERRY CARLOS 42 year old female presents to the clinic for hypertension management . She  has no past medical history on file.  Patient here for follow-up of elevated blood pressure as per our last visit. She is exercising and is adherent to low salt diet.  Blood pressure is well controlled at home. Patient denies cardiac symptoms chest pain, dyspnea, fatigue, lower extremity edema, palpitations, syncope, and tachypnea.  Cardiovascular risk factors: diabetes mellitus, dyslipidemia, and obesity (BMI >= 30 kg/m2).    Outpatient Encounter Medications as of 01/13/2023  Medication Sig   Accu-Chek Softclix Lancets lancets SMARTSIG:Topical   Blood Glucose Monitoring Suppl (ACCU-CHEK GUIDE ME) w/Device KIT USE TO CHECK BLOOD GLUCOSE THREE TIMES DAILY   Blood Glucose Monitoring Suppl DEVI 1 each by Does not apply route in the morning, at noon, and at bedtime. May substitute to any manufacturer covered by patient's insurance.   Dulaglutide (TRULICITY) [id number] [id number] SOPN Inject 0.75 mg into the skin once a week.   glimepiride (AMARYL) 1 MG tablet Take 1 tablet (1 mg total) by mouth daily with breakfast.   Glucose Blood (BLOOD GLUCOSE TEST STRIPS) STRP 1 each by In Vitro route in the morning, at noon, and at bedtime. May substitute to any manufacturer covered by patient's insurance.   guaiFENesin-codeine 100-10 MG/5ML syrup Take 10 mLs by mouth 3 (three) times daily as needed for cough.   ibuprofen (ADVIL,MOTRIN) 200 MG tablet Take 400 mg by mouth every 6 (six) hours as needed.   Lancet Device MISC 1 each by Does not apply route in the morning, at noon, and at bedtime. May substitute to any manufacturer covered by patient's insurance.    Lancets Misc. (ACCU-CHEK SOFTCLIX LANCET DEV) KIT USE TO CHECK BLOOD GLUCOSE THREE TIMES DAILY   Lancets Misc. MISC 1 each by Does not apply route in the morning, at noon, and at bedtime. May substitute to any manufacturer covered by patient's insurance.   megestrol (MEGACE) 40 MG tablet 3 tablets a day for 5 days, 2 tablets a day for 5 days then 1 tablet daily   metFORMIN (GLUCOPHAGE) 1000 MG tablet Take 1 tablet (1,000 mg total) by mouth 2 (two) times daily with a meal.   rosuvastatin (CRESTOR) 5 MG tablet Take 1 tablet (5 mg total) by mouth daily.   No facility-administered encounter medications on file as of 01/13/2023.    No past surgical history on file.  Review of Systems  Constitutional:  Negative for chills and fever.  Eyes:  Negative for blurred vision.  Respiratory:  Negative for shortness of breath.   Cardiovascular:  Negative for chest pain.  Neurological:  Negative for dizziness and headaches.      Objective    BP 112/78   LMP 11/13/2022 (Exact Date)   Physical Exam Vitals reviewed.  Cardiovascular:     Rate and Rhythm: Normal rate and regular rhythm.     Pulses: Normal pulses.  Pulmonary:     Effort: Pulmonary effort is normal.     Breath sounds: Normal breath sounds.  Musculoskeletal:        General: Normal range of motion.  Right lower leg: No edema.     Left lower leg: No edema.  Skin:    General: Skin is warm and dry.     Capillary Refill: Capillary refill takes less than 2 seconds.  Neurological:     Mental Status: She is alert.     Coordination: Coordination normal.     Gait: Gait normal.  Psychiatric:        Mood and Affect: Mood normal.       Assessment & Plan:  IFG (impaired fasting glucose) -     Hemoglobin A1c  Primary hypertension -     Microalbumin / creatinine urine ratio -     CMP14+EGFR -     CBC with Differential/Platelet  Mixed hyperlipidemia -     Lipid panel  Initial high blood pressure determined by  examination Assessment & Plan: Vitals:   01/13/23 1049  BP: 112/78  At home blood pressure readings 131/82, 121/75, 118/73, 120/71, 122/73, 132/76 Blood pressure well controlled.  Discussed to continue non pharmacological interventions follow diet low in saturated fat, reduce dietary salt intake, avoid fatty foods, maintain an exercise routine 3 to 5 days a week for a minimum total of 150 minutes. Patient verbalizes understanding regarding plan of care and all questions answered.  No medication intervention in today's visit     Return in about 3 months (around 04/13/2023) for chronic follow-up, diabetes, hyperlipidemia/ high cholestrol, labs 5 days before our visit fasting .   Renard Hamper Ria Comment, FNP

## 2023-01-25 ENCOUNTER — Other Ambulatory Visit (HOSPITAL_COMMUNITY)
Admission: RE | Admit: 2023-01-25 | Discharge: 2023-01-25 | Disposition: A | Discharge status: Home / Self Care | Payer: Medicaid Other | Source: Ambulatory Visit | Attending: Obstetrics & Gynecology | Admitting: Obstetrics & Gynecology

## 2023-01-25 ENCOUNTER — Encounter: Payer: Self-pay | Admitting: Obstetrics & Gynecology

## 2023-01-25 ENCOUNTER — Ambulatory Visit (INDEPENDENT_AMBULATORY_CARE_PROVIDER_SITE_OTHER): Payer: Medicaid Other | Admitting: Obstetrics & Gynecology

## 2023-01-25 VITALS — BP 132/82 | HR 94 | Ht 64.0 in | Wt 221.0 lb

## 2023-01-25 DIAGNOSIS — Z01419 Encounter for gynecological examination (general) (routine) without abnormal findings: Secondary | ICD-10-CM

## 2023-01-25 DIAGNOSIS — Z1231 Encounter for screening mammogram for malignant neoplasm of breast: Secondary | ICD-10-CM

## 2023-01-25 MED ORDER — MEGESTROL ACETATE 40 MG PO TABS: ORAL_TABLET | ORAL | 12 refills | Status: DC

## 2023-01-25 NOTE — Progress Notes (Signed)
Subjective:     Brittney Caldwell is a 42 y.o. female here for a routine exam.  Patient's last menstrual period was 01/11/2023. G1P0101 Birth Control Method:  none Menstrual Calendar(currently): heavy a bit irregular  Current complaints: bled for 1 month, heavy.   Current acute medical issues:     Recent Gynecologic History Patient's last menstrual period was 01/11/2023. Last Pap: 2019,   Last mammogram: never,    History reviewed. No pertinent past medical history.  History reviewed. No pertinent surgical history.  OB History     Gravida  1   Para  1   Term      Preterm  1   AB      Living  1      SAB      IAB      Ectopic      Multiple      Live Births              Social History   Socioeconomic History   Marital status: Single    Spouse name: Not on file   Number of children: Not on file   Years of education: Not on file   Highest education level: Not on file  Occupational History   Not on file  Tobacco Use   Smoking status: Every Day    Packs/day: 0.25    Years: 15.00    Total pack years: 3.75    Types: Cigarettes   Smokeless tobacco: Never  Vaping Use   Vaping Use: Never used  Substance and Sexual Activity   Alcohol use: Yes    Alcohol/week: 0.0 standard drinks of alcohol    Comment: occasional   Drug use: Not Currently    Types: Marijuana    Comment: 2 weeks ago   Sexual activity: Yes    Birth control/protection: None  Other Topics Concern   Not on file  Social History Narrative   Not on file   Social Determinants of Health   Financial Resource Strain: Low Risk  (01/25/2023)   Overall Financial Resource Strain (CARDIA)    Difficulty of Paying Living Expenses: Not very hard  Food Insecurity: Food Insecurity Present (01/25/2023)   Hunger Vital Sign    Worried About Running Out of Food in the Last Year: Sometimes true    Ran Out of Food in the Last Year: Never true  Transportation Needs: No Transportation Needs (01/25/2023)    PRAPARE - Hydrologist (Medical): No    Lack of Transportation (Non-Medical): No  Physical Activity: Insufficiently Active (01/25/2023)   Exercise Vital Sign    Days of Exercise per Week: 2 days    Minutes of Exercise per Session: 30 min  Stress: Stress Concern Present (01/25/2023)   [place]    Feeling of Stress : To some extent  Social Connections: Moderately Isolated (01/25/2023)   Social Connection and Isolation Panel [NHANES]    Frequency of Communication with Friends and Family: More than three times a week    Frequency of Social Gatherings with Friends and Family: More than three times a week    Attends Religious Services: 1 to 4 times per year    Active Member of [employer] or Organizations: No    Attends Archivist Meetings: Never    Marital Status: Never married    Family History  Problem Relation Age of Onset   Hypertension Mother  Hyperlipidemia Mother    Diabetes Mother    Hypertension Father    Hyperlipidemia Father    Diabetes Father    Asthma Son    Diabetes Maternal Grandmother    Diabetes Paternal Grandfather      Current Outpatient Medications:    Accu-Chek Softclix Lancets lancets, SMARTSIG:Topical, Disp: , Rfl:    Blood Glucose Monitoring Suppl (ACCU-CHEK GUIDE ME) w/Device KIT, USE TO CHECK BLOOD GLUCOSE THREE TIMES DAILY, Disp: , Rfl:    Blood Glucose Monitoring Suppl DEVI, 1 each by Does not apply route in the morning, at noon, and at bedtime. May substitute to any manufacturer covered by patient's insurance., Disp: 1 each, Rfl: 0   Dulaglutide (TRULICITY) [id number] SOPN, Inject 0.75 mg into the skin once a week., Disp: 6 mL, Rfl: 2   glimepiride (AMARYL) 1 MG tablet, Take 1 tablet (1 mg total) by mouth daily with breakfast., Disp: 30 tablet, Rfl: 3   Glucose Blood (BLOOD GLUCOSE TEST STRIPS) STRP, 1 each by In Vitro route in the morning, at noon, and  at bedtime. May substitute to any manufacturer covered by patient's insurance., Disp: 100 strip, Rfl: 0   Lancets Misc. (ACCU-CHEK SOFTCLIX LANCET DEV) KIT, USE TO CHECK BLOOD GLUCOSE THREE TIMES DAILY, Disp: , Rfl:    Lancets Misc. MISC, 1 each by Does not apply route in the morning, at noon, and at bedtime. May substitute to any manufacturer covered by patient's insurance., Disp: 100 each, Rfl: 0   megestrol (MEGACE) 40 MG tablet, 3 tablets a day for 5 days, 2 tablets a day for 5 days then 1 tablet daily, Disp: 45 tablet, Rfl: 3   megestrol (MEGACE) 40 MG tablet, 1 tablet daily for 21 days, then off 7 days(mimic progesterone only birth control pill cycling), Disp: 21 tablet, Rfl: 12   metFORMIN (GLUCOPHAGE) 1000 MG tablet, Take 1 tablet (1,000 mg total) by mouth 2 (two) times daily with a meal., Disp: 90 tablet, Rfl: 3   rosuvastatin (CRESTOR) 5 MG tablet, Take 1 tablet (5 mg total) by mouth daily., Disp: 90 tablet, Rfl: 3   guaiFENesin-codeine 100-10 MG/5ML syrup, Take 10 mLs by mouth 3 (three) times daily as needed for cough. (Patient not taking: Reported on 01/25/2023), Disp: 120 mL, Rfl: 0   ibuprofen (ADVIL,MOTRIN) 200 MG tablet, Take 400 mg by mouth every 6 (six) hours as needed. (Patient not taking: Reported on 01/25/2023), Disp: , Rfl:    Lancet Device MISC, 1 each by Does not apply route in the morning, at noon, and at bedtime. May substitute to any manufacturer covered by patient's insurance. (Patient not taking: Reported on 01/25/2023), Disp: 1 each, Rfl: 0  Review of Systems  Review of Systems  Constitutional: Negative for fever, chills, weight loss, malaise/fatigue and diaphoresis.  HENT: Negative for hearing loss, ear pain, nosebleeds, congestion, sore throat, neck pain, tinnitus and ear discharge.   Eyes: Negative for blurred vision, double vision, photophobia, pain, discharge and redness.  Respiratory: Negative for cough, hemoptysis, sputum production, shortness of breath, wheezing  and stridor.   Cardiovascular: Negative for chest pain, palpitations, orthopnea, claudication, leg swelling and PND.  Gastrointestinal: negative for abdominal pain. Negative for heartburn, nausea, vomiting, diarrhea, constipation, blood in stool and melena.  Genitourinary: Negative for dysuria, urgency, frequency, hematuria and flank pain.  Musculoskeletal: Negative for myalgias, back pain, joint pain and falls.  Skin: Negative for itching and rash.  Neurological: Negative for dizziness, tingling, tremors, sensory change, speech change, focal weakness, seizures,  loss of consciousness, weakness and headaches.  Endo/Heme/Allergies: Negative for environmental allergies and polydipsia. Does not bruise/bleed easily.  Psychiatric/Behavioral: Negative for depression, suicidal ideas, hallucinations, memory loss and substance abuse. The patient is not nervous/anxious and does not have insomnia.        Objective:  Blood pressure 132/82, pulse 94, height '5\' 4"'$  (1.626 m), weight 221 lb (100.2 kg), last menstrual period 01/11/2023.   Physical Exam  Vitals reviewed. Constitutional: She is oriented to person, place, and time. She appears well-developed and well-nourished.  HENT:  Head: Normocephalic and atraumatic.        Right Ear: External ear normal.  Left Ear: External ear normal.  Nose: Nose normal.  Mouth/Throat: Oropharynx is clear and moist.  Eyes: Conjunctivae and EOM are normal. Pupils are equal, round, and reactive to light. Right eye exhibits no discharge. Left eye exhibits no discharge. No scleral icterus.  Neck: Normal range of motion. Neck supple. No tracheal deviation present. No thyromegaly present.  Cardiovascular: Normal rate, regular rhythm, normal heart sounds and intact distal pulses.  Exam reveals no gallop and no friction rub.   No murmur heard. Respiratory: Effort normal and breath sounds normal. No respiratory distress. She has no wheezes. She has no rales. She exhibits no  tenderness.  GI: Soft. Bowel sounds are normal. She exhibits no distension and no mass. There is no tenderness. There is no rebound and no guarding.  Genitourinary:  Breasts no masses skin changes or nipple changes bilaterally      Vulva is normal without lesions Vagina is pink moist without discharge Cervix normal in appearance and pap is done Uterus is normal size shape and contour Adnexa is negative with normal sized ovaries   Musculoskeletal: Normal range of motion. She exhibits no edema and no tenderness.  Neurological: She is alert and oriented to person, place, and time. She has normal reflexes. She displays normal reflexes. No cranial nerve deficit. She exhibits normal muscle tone. Coordination normal.  Skin: Skin is warm and dry. No rash noted. No erythema. No pallor.  Psychiatric: She has a normal mood and affect. Her behavior is normal. Judgment and thought content normal.       Medications Ordered at today's visit: Meds ordered this encounter  Medications   megestrol (MEGACE) 40 MG tablet    Sig: 1 tablet daily for 21 days, then off 7 days(mimic progesterone only birth control pill cycling)    Dispense:  21 tablet    Refill:  12    Other orders placed at today's visit: Orders Placed This Encounter  Procedures   MM 3D SCREENING MAMMOGRAM BILATERAL BREAST      Assessment:    Normal Gyn exam.   menometrorrhagia Plan:    Will cycle with megestrol after the high dose regimen   Mammogram ordered   Return in about 4 months (around 05/27/2023) for Follow up, with Dr Elonda Husky.

## 2023-01-28 LAB — CYTOLOGY - PAP
Chlamydia: NEGATIVE
Comment: NEGATIVE
Comment: NEGATIVE
Comment: NEGATIVE
Comment: NORMAL
Diagnosis: NEGATIVE
High risk HPV: NEGATIVE
Neisseria Gonorrhea: NEGATIVE
Trichomonas: NEGATIVE

## 2023-01-29 ENCOUNTER — Ambulatory Visit (HOSPITAL_COMMUNITY)
Admission: RE | Admit: 2023-01-29 | Discharge: 2023-01-29 | Disposition: A | Discharge status: Home / Self Care | Payer: Medicaid Other | Source: Ambulatory Visit | Attending: Obstetrics & Gynecology | Admitting: Obstetrics & Gynecology

## 2023-01-29 ENCOUNTER — Encounter (HOSPITAL_COMMUNITY): Payer: Self-pay

## 2023-01-29 DIAGNOSIS — Z1231 Encounter for screening mammogram for malignant neoplasm of breast: Secondary | ICD-10-CM | POA: Insufficient documentation

## 2023-02-01 ENCOUNTER — Other Ambulatory Visit (HOSPITAL_COMMUNITY): Payer: Self-pay | Admitting: Obstetrics & Gynecology

## 2023-02-01 DIAGNOSIS — R928 Other abnormal and inconclusive findings on diagnostic imaging of breast: Secondary | ICD-10-CM

## 2023-02-02 ENCOUNTER — Telehealth: Payer: Self-pay

## 2023-02-02 NOTE — Progress Notes (Signed)
   Care Guide Note  02/02/2023 Name: Brittney Caldwell MRN: [id number] DOB: 01-19-1981  Referred by: Juanda Chance, FNP Reason for referral : Care Coordination (Outreach to schedule with Pharm d New MM DM )   Brittney Caldwell is a 42 y.o. year old female who is a primary care patient of Del Eli Hose, FNP. Matthew Folks was referred to the pharmacist for assistance related to DM.    Successful contact was made with the patient to discuss pharmacy services including being ready for the pharmacist to call at least 5 minutes before the scheduled appointment time, to have medication bottles and any blood sugar or blood pressure readings ready for review. The patient agreed to meet with the pharmacist via with the pharmacist via telephone visit on (date/time).  02/10/2023  [name], [place], [place] [address] Direct Dial: [phone] [email or web address]

## 2023-02-10 ENCOUNTER — Other Ambulatory Visit: Payer: Medicaid Other | Admitting: Pharmacist

## 2023-02-18 ENCOUNTER — Encounter (HOSPITAL_COMMUNITY): Payer: Self-pay

## 2023-02-18 ENCOUNTER — Ambulatory Visit (HOSPITAL_COMMUNITY)
Admission: RE | Admit: 2023-02-18 | Discharge: 2023-02-18 | Disposition: A | Discharge status: Home / Self Care | Payer: Medicaid Other | Source: Ambulatory Visit | Attending: Obstetrics & Gynecology | Admitting: Obstetrics & Gynecology

## 2023-02-18 DIAGNOSIS — R928 Other abnormal and inconclusive findings on diagnostic imaging of breast: Secondary | ICD-10-CM | POA: Diagnosis not present

## 2023-02-18 DIAGNOSIS — N6489 Other specified disorders of breast: Secondary | ICD-10-CM | POA: Diagnosis not present

## 2023-03-24 NOTE — Patient Instructions (Signed)

## 2023-03-30 ENCOUNTER — Ambulatory Visit: Payer: Medicaid Other | Admitting: Family Medicine

## 2023-03-30 ENCOUNTER — Encounter: Payer: Self-pay | Admitting: Family Medicine

## 2023-03-30 VITALS — BP 123/80 | HR 82 | Ht 64.0 in | Wt 203.0 lb

## 2023-03-30 DIAGNOSIS — Z13220 Encounter for screening for lipoid disorders: Secondary | ICD-10-CM

## 2023-03-30 DIAGNOSIS — E785 Hyperlipidemia, unspecified: Secondary | ICD-10-CM | POA: Diagnosis not present

## 2023-03-30 DIAGNOSIS — Z7984 Long term (current) use of oral hypoglycemic drugs: Secondary | ICD-10-CM

## 2023-03-30 DIAGNOSIS — E559 Vitamin D deficiency, unspecified: Secondary | ICD-10-CM

## 2023-03-30 DIAGNOSIS — M25561 Pain in right knee: Secondary | ICD-10-CM | POA: Insufficient documentation

## 2023-03-30 DIAGNOSIS — E1169 Type 2 diabetes mellitus with other specified complication: Secondary | ICD-10-CM | POA: Diagnosis not present

## 2023-03-30 DIAGNOSIS — R7301 Impaired fasting glucose: Secondary | ICD-10-CM | POA: Diagnosis not present

## 2023-03-30 DIAGNOSIS — E119 Type 2 diabetes mellitus without complications: Secondary | ICD-10-CM | POA: Insufficient documentation

## 2023-03-30 DIAGNOSIS — E782 Mixed hyperlipidemia: Secondary | ICD-10-CM | POA: Diagnosis not present

## 2023-03-30 DIAGNOSIS — I10 Essential (primary) hypertension: Secondary | ICD-10-CM | POA: Diagnosis not present

## 2023-03-30 MED ORDER — TRULICITY 1.5 MG/0.5ML ~~LOC~~ SOAJ
1.5000 mg | SUBCUTANEOUS | 2 refills | Status: DC
Start: 2023-03-30 — End: 2023-06-21

## 2023-03-30 NOTE — Assessment & Plan Note (Addendum)
Hemoglobin A1C 11.1 reading on 01/04/2023 Labs ordered today awaiting results will follow up.  LDL 124- taking Crestor 5 mg daily with low-fat diet.  Patient reporting taking type 2 diabete medications since february are Trulicity 0.75 mg, glimepiride 1 mg and metformin 1000 mg twice daily. Discussed medication desired effects, potential side effects, and how to administer the medication. Nonpharmacological interventions such as low carb diet,high in protein, vegetables and fruit discussed. Educated on importance of physical activity 150 minutes per week. Discussed signs and symptoms of hypoglycemia and need to present to the ED. Follow up in 3 months or sooner if needed. Patient verbalizes understanding regarding plan of care and all questions answered. Ophthalmology exam up to date, Foot exam within desired limits.

## 2023-03-30 NOTE — Progress Notes (Signed)
Patient Office Visit   Subjective   Patient ID: Brittney Caldwell, female    DOB: [birth year]  Age: 42 y.o. MRN: [id number]  CC:  Chief Complaint  Patient presents with   Chronic Care Management    3 month f/u for diabetes, hyperlipidemia/ high cholestrol.    Leg Pain    Pt reports knee and foot pain from standing long hours at work, reports swelling on her right foot radiating up to knee. Worsened in the last week.     HPI Brittney Caldwell 42 year old female, presents to the clinic for chronic follow up and right knee pain. She  has a past medical history of Hyperlipidemia and Type 2 diabetes mellitus (HCC).For the details of today's visit, please refer to assessment and plan.   Knee Pain  There was no injury mechanism. The pain is present in the right knee. The quality of the pain is described as aching and shooting. The pain is at a severity of 9/10. The pain has been Fluctuating since onset. Associated symptoms include an inability to bear weight and muscle weakness. Pertinent negatives include no loss of motion, loss of sensation, numbness or tingling. The symptoms are aggravated by movement and weight bearing. She has tried acetaminophen for the symptoms. The treatment provided significant relief.      Outpatient Encounter Medications as of 03/30/2023  Medication Sig   Accu-Chek Softclix Lancets lancets SMARTSIG:Topical   Blood Glucose Monitoring Suppl (ACCU-CHEK GUIDE ME) w/Device KIT USE TO CHECK BLOOD GLUCOSE THREE TIMES DAILY   Blood Glucose Monitoring Suppl DEVI 1 each by Does not apply route in the morning, at noon, and at bedtime. May substitute to any manufacturer covered by patient's insurance.   Dulaglutide (TRULICITY) 1.5 MG/0.5ML SOPN Inject 1.5 mg into the skin once a week.   glimepiride (AMARYL) 1 MG tablet Take 1 tablet (1 mg total) by mouth daily with breakfast.   guaiFENesin-codeine 100-10 MG/5ML syrup Take 10 mLs by mouth 3 (three) times daily as needed for cough.    ibuprofen (ADVIL,MOTRIN) 200 MG tablet Take 400 mg by mouth every 6 (six) hours as needed.   Lancets Misc. (ACCU-CHEK SOFTCLIX LANCET DEV) KIT USE TO CHECK BLOOD GLUCOSE THREE TIMES DAILY   megestrol (MEGACE) 40 MG tablet 3 tablets a day for 5 days, 2 tablets a day for 5 days then 1 tablet daily   megestrol (MEGACE) 40 MG tablet 1 tablet daily for 21 days, then off 7 days(mimic progesterone only birth control pill cycling)   metFORMIN (GLUCOPHAGE) 1000 MG tablet Take 1 tablet (1,000 mg total) by mouth 2 (two) times daily with a meal.   rosuvastatin (CRESTOR) 5 MG tablet Take 1 tablet (5 mg total) by mouth daily.   [DISCONTINUED] Dulaglutide (TRULICITY) 0.75 MG/0.5ML SOPN Inject 0.75 mg into the skin once a week.   No facility-administered encounter medications on file as of 03/30/2023.    History reviewed. No pertinent surgical history.  Review of Systems  Constitutional:  Negative for chills and fever.  Eyes:  Negative for blurred vision.  Respiratory:  Negative for shortness of breath.   Cardiovascular:  Negative for chest pain.  Gastrointestinal:  Negative for abdominal pain, nausea and vomiting.  Genitourinary:  Negative for dysuria.  Musculoskeletal:  Positive for joint pain and myalgias. Negative for falls.  Skin:  Negative for itching and rash.  Neurological:  Negative for dizziness, tingling, numbness and headaches.      Objective    BP 123/80  Pulse 82   Ht 5\' 4"  (1.626 m)   Wt 203 lb (92.1 kg)   SpO2 98%   BMI 34.84 kg/m   Physical Exam Vitals reviewed.  Constitutional:      General: She is not in acute distress.    Appearance: Normal appearance. She is not ill-appearing, toxic-appearing or diaphoretic.  HENT:     Head: Normocephalic.  Eyes:     General:        Right eye: No discharge.        Left eye: No discharge.     Conjunctiva/sclera: Conjunctivae normal.  Cardiovascular:     Rate and Rhythm: Normal rate.     Pulses: Normal pulses.     Heart sounds:  Normal heart sounds.  Pulmonary:     Effort: Pulmonary effort is normal. No respiratory distress.     Breath sounds: Normal breath sounds.  Abdominal:     General: Bowel sounds are normal.     Palpations: Abdomen is soft.     Tenderness: There is no left CVA tenderness.  Musculoskeletal:        General: No swelling or tenderness. Normal range of motion.     Cervical back: Normal range of motion.     Right knee: Normal. No swelling or erythema. Normal range of motion.     Left knee: Normal. No swelling or erythema. Normal range of motion.  Skin:    General: Skin is warm and dry.     Capillary Refill: Capillary refill takes less than 2 seconds.  Neurological:     General: No focal deficit present.     Mental Status: She is alert and oriented to person, place, and time.     Coordination: Coordination normal.     Gait: Gait normal.  Psychiatric:        Mood and Affect: Mood normal.        Behavior: Behavior normal.       Assessment & Plan:  Type 2 diabetes mellitus without complication, without long-term current use of insulin (HCC) -     Trulicity; Inject 1.5 mg into the skin once a week.  Dispense: 2 mL; Refill: 2  Right knee pain, unspecified chronicity Assessment & Plan: Right knee xray ordered- awaiting results will follow up. Explained to patient Non pharmacological interventions include the use of ice or heat, rest, recommend range of motion exercises, gentle stretching. The use of NSAIDs for pain management.  Follow up for worsening or persistent symptoms. Patient verbalizes understanding regarding plan of care and all questions answered.   Orders: -     DG Knee 1-2 Views Right; Future  Hyperlipidemia associated with type 2 diabetes mellitus (HCC) Assessment & Plan: Hemoglobin A1C 11.1 reading on 01/04/2023 Labs ordered today awaiting results will follow up.  LDL 124- taking Crestor 5 mg daily with low-fat diet.  Patient reporting taking type 2 diabete medications  since february are Trulicity 0.75 mg, glimepiride 1 mg and metformin 1000 mg twice daily. Discussed medication desired effects, potential side effects, and how to administer the medication. Nonpharmacological interventions such as low carb diet,high in protein, vegetables and fruit discussed. Educated on importance of physical activity 150 minutes per week. Discussed signs and symptoms of hypoglycemia and need to present to the ED. Follow up in 3 months or sooner if needed. Patient verbalizes understanding regarding plan of care and all questions answered. Ophthalmology exam up to date, Foot exam within desired limits.     Return in about  3 months (around 06/30/2023), or if symptoms worsen or fail to improve, for chronic follow-up, diabetes, hyperlipidemia/ high cholestrol.   Cruzita Lederer Newman Nip, FNP

## 2023-03-30 NOTE — Assessment & Plan Note (Signed)
Right knee xray ordered- awaiting results will follow up. Explained to patient Non pharmacological interventions include the use of ice or heat, rest, recommend range of motion exercises, gentle stretching. The use of NSAIDs for pain management.  Follow up for worsening or persistent symptoms. Patient verbalizes understanding regarding plan of care and all questions answered.

## 2023-03-31 ENCOUNTER — Ambulatory Visit (HOSPITAL_COMMUNITY)
Admission: RE | Admit: 2023-03-31 | Discharge: 2023-03-31 | Disposition: A | Discharge status: Home / Self Care | Payer: Medicaid Other | Source: Ambulatory Visit | Attending: Family Medicine | Admitting: Family Medicine

## 2023-03-31 DIAGNOSIS — M25561 Pain in right knee: Secondary | ICD-10-CM | POA: Diagnosis not present

## 2023-04-01 LAB — CMP14+EGFR
ALT: 13 IU/L (ref 0–32)
AST: 12 IU/L (ref 0–40)
Albumin/Globulin Ratio: 1.8 (ref 1.2–2.2)
Albumin: 4.2 g/dL (ref 3.9–4.9)
Alkaline Phosphatase: 62 IU/L (ref 44–121)
BUN/Creatinine Ratio: 11 (ref 9–23)
BUN: 9 mg/dL (ref 6–24)
Bilirubin Total: 0.5 mg/dL (ref 0.0–1.2)
CO2: 17 mmol/L — ABNORMAL LOW (ref 20–29)
Calcium: 9.8 mg/dL (ref 8.7–10.2)
Chloride: 105 mmol/L (ref 96–106)
Creatinine, Ser: 0.83 mg/dL (ref 0.57–1.00)
Globulin, Total: 2.4 g/dL (ref 1.5–4.5)
Glucose: 203 mg/dL — ABNORMAL HIGH (ref 70–99)
Potassium: 4.2 mmol/L (ref 3.5–5.2)
Sodium: 137 mmol/L (ref 134–144)
Total Protein: 6.6 g/dL (ref 6.0–8.5)
eGFR: 91 mL/min/{1.73_m2} (ref >59)

## 2023-04-01 LAB — CBC WITH DIFFERENTIAL/PLATELET
Basophils Absolute: 0 10*3/uL (ref 0.0–0.2)
Basos: 0 %
EOS (ABSOLUTE): 0.1 10*3/uL (ref 0.0–0.4)
Eos: 1 %
Hematocrit: 44.5 % (ref 34.0–46.6)
Hemoglobin: 14.9 g/dL (ref 11.1–15.9)
Immature Grans (Abs): 0 10*3/uL (ref 0.0–0.1)
Immature Granulocytes: 0 %
Lymphocytes Absolute: 3.9 10*3/uL — ABNORMAL HIGH (ref 0.7–3.1)
Lymphs: 41 %
MCH: 30.5 pg (ref 26.6–33.0)
MCHC: 33.5 g/dL (ref 31.5–35.7)
MCV: 91 fL (ref 79–97)
Monocytes Absolute: 0.4 10*3/uL (ref 0.1–0.9)
Monocytes: 5 %
Neutrophils Absolute: 5.1 10*3/uL (ref 1.4–7.0)
Neutrophils: 53 %
Platelets: 215 10*3/uL (ref 150–450)
RBC: 4.89 x10E6/uL (ref 3.77–5.28)
RDW: 12.9 % (ref 11.7–15.4)
WBC: 9.6 10*3/uL (ref 3.4–10.8)

## 2023-04-01 LAB — MICROALBUMIN / CREATININE URINE RATIO
Creatinine, Urine: 236.1 mg/dL
Microalb/Creat Ratio: 8 mg/g creat (ref 0–29)
Microalbumin, Urine: 17.9 ug/mL

## 2023-04-01 LAB — LIPID PANEL
Chol/HDL Ratio: 3.7 ratio (ref 0.0–4.4)
Cholesterol, Total: 99 mg/dL — ABNORMAL LOW (ref 100–199)
HDL: 27 mg/dL — ABNORMAL LOW (ref >39)
LDL Chol Calc (NIH): 51 mg/dL (ref 0–99)
Triglycerides: 110 mg/dL (ref 0–149)
VLDL Cholesterol Cal: 21 mg/dL (ref 5–40)

## 2023-04-01 LAB — HEMOGLOBIN A1C
Est. average glucose Bld gHb Est-mCnc: 203 mg/dL
Hgb A1c MFr Bld: 8.7 % — ABNORMAL HIGH (ref 4.8–5.6)

## 2023-04-06 ENCOUNTER — Other Ambulatory Visit: Payer: Self-pay | Admitting: Family Medicine

## 2023-04-06 DIAGNOSIS — E1165 Type 2 diabetes mellitus with hyperglycemia: Secondary | ICD-10-CM

## 2023-04-14 ENCOUNTER — Ambulatory Visit: Payer: Medicaid Other | Admitting: Family Medicine

## 2023-04-16 ENCOUNTER — Other Ambulatory Visit: Payer: Self-pay | Admitting: Family Medicine

## 2023-04-20 NOTE — Progress Notes (Signed)
02/10/23/2024 Name: Brittney Caldwell MRN: [id number] DOB: August 10, 1981  Diabetes   Brittney Caldwell is a 42 y.o. year old female who presented for a telephone visit.   They were referred to the pharmacist by their PCP for assistance in managing diabetes.   Patient is participating in a Managed Medicaid Plan:  Yes  Subjective:  Care Team: Primary Care Provider: Rica Records, FNP   Medication Access/Adherence  Current Pharmacy:  [facility] [phone] - [place], [place] - [address] AT [facility] [address] [place] [address] Phone: [phone] Fax: [phone]   Patient reports affordability concerns with their medications: No  Patient reports access/transportation concerns to their pharmacy: No  Patient reports adherence concerns with their medications:  No     Diabetes:  Current medications: new start Trulicity, glimepiride, metformin Medications tried in the past: n/a  Current glucose readings: FBG<180, usually 160s Using accucheck meter; testing 1-3 times daily   Current meal patterns:  Discussed meal planning options and Plate method for healthy eating Avoid sugary drinks and desserts Incorporate balanced protein, non starchy veggies, 1 serving of carbohydrate with each meal Increase water intake Increase physical activity as able  Current physical activity: encouraged as able  Current medication access support: medicaid   Objective:  Lab Results  Component Value Date   HGBA1C 8.7 (H) 03/30/2023    Lab Results  Component Value Date   CREATININE 0.83 03/30/2023   BUN 9 03/30/2023   NA 137 03/30/2023   K 4.2 03/30/2023   CL 105 03/30/2023   CO2 17 (L) 03/30/2023    Lab Results  Component Value Date   CHOL 99 (L) 03/30/2023   HDL 27 (L) 03/30/2023   LDLCALC 51 03/30/2023   TRIG 110 03/30/2023   CHOLHDL 3.7 03/30/2023    Medications Reviewed Today     Reviewed by Herbie Saxon, CMA  (Certified Medical Assistant) on 03/30/23 at 0930  Med List Status: <None>   Medication Order Taking? Sig Documenting Provider Last Dose Status Informant  Accu-Chek Softclix Lancets lancets [id number] Yes SMARTSIG:Topical [provider] Taking Active   Blood Glucose Monitoring Suppl (ACCU-CHEK GUIDE ME) w/Device KIT [id number] Yes USE TO CHECK BLOOD GLUCOSE THREE TIMES DAILY [provider] Taking Active   Blood Glucose Monitoring Suppl DEVI [id number] Yes 1 each by Does not apply route in the morning, at noon, and at bedtime. May substitute to any manufacturer covered by patient's insurance. Del Newman Nip, Tenna Child, FNP Taking Active   Dulaglutide (TRULICITY) 0.75 MG/0.5ML Namon Cirri [id number] Yes Inject 0.75 mg into the skin once a week. Del Newman Nip, Tenna Child, FNP Taking Active   glimepiride (AMARYL) 1 MG tablet [id number] Yes Take 1 tablet (1 mg total) by mouth daily with breakfast. Del Newman Nip, Tenna Child, FNP Taking Active   guaiFENesin-codeine 100-10 MG/5ML syrup [id number] Yes Take 10 mLs by mouth 3 (three) times daily as needed for cough. Terrilee Files, MD Taking Active   ibuprofen (ADVIL,MOTRIN) 200 MG tablet [id number] Yes Take 400 mg by mouth every 6 (six) hours as needed. [provider] Taking Active Self  Lancets Misc. (ACCU-CHEK SOFTCLIX LANCET DEV) KIT [id number] Yes USE TO CHECK BLOOD GLUCOSE THREE TIMES DAILY [provider] Taking Active   megestrol (MEGACE) 40 MG tablet [id number] Yes 3 tablets a day for 5 days, 2 tablets a day for 5 days then 1 tablet daily Burgess Amor, PA-C Taking Active  megestrol (MEGACE) 40 MG tablet [id number] Yes 1 tablet daily for 21 days, then off 7 days(mimic progesterone only birth control pill cycling) Eure, Amaryllis Dyke, MD Taking Active   metFORMIN (GLUCOPHAGE) 1000 MG tablet [id number] Yes Take 1 tablet (1,000 mg total) by mouth 2 (two) times daily with a meal. Del Newman Nip, Tenna Child, FNP Taking Active   rosuvastatin  (CRESTOR) 5 MG tablet [id number] Yes Take 1 tablet (5 mg total) by mouth daily. Del Newman Nip, Tenna Child, FNP Taking Active               Assessment/Plan:   Diabetes: - Currently uncontrolled - Reviewed long term cardiovascular and renal outcomes of uncontrolled blood sugar - Reviewed goal A1c, goal fasting, and goal 2 hour post prandial glucose - Reviewed dietary modifications including FOLLOWING A HEART HEALTHY DIET/HEALTHY PLATE METHOD - Recommend to increase trulicity to 1.5mg  as next fill; continue other meds as prescribed Denies personal and family history of Medullary thyroid cancer (MTC)  - Recommend to check glucose daily (fasting) or if symptomatic   Follow Up Plan: 3 months  Kieth Brightly, PharmD, BCACP Clinical Pharmacist, [facility]

## 2023-05-12 ENCOUNTER — Other Ambulatory Visit: Payer: Medicaid Other | Admitting: Pharmacist

## 2023-05-27 ENCOUNTER — Ambulatory Visit: Payer: Medicaid Other | Admitting: Obstetrics & Gynecology

## 2023-05-27 ENCOUNTER — Encounter: Payer: Self-pay | Admitting: Obstetrics & Gynecology

## 2023-05-27 VITALS — BP 128/85 | HR 92 | Ht 64.5 in | Wt 209.0 lb

## 2023-05-27 DIAGNOSIS — N920 Excessive and frequent menstruation with regular cycle: Secondary | ICD-10-CM | POA: Diagnosis not present

## 2023-05-27 DIAGNOSIS — N946 Dysmenorrhea, unspecified: Secondary | ICD-10-CM | POA: Diagnosis not present

## 2023-05-27 MED ORDER — MEGESTROL ACETATE 40 MG PO TABS: ORAL_TABLET | ORAL | 12 refills | Status: DC

## 2023-05-27 NOTE — Progress Notes (Signed)
Follow up appointment for response: menometrorrhagia  Chief Complaint  Patient presents with   Follow-up    Megace-bleeding better when taking    Blood pressure 128/85, pulse 92, height 5' 4.5" (1.638 m), weight 209 lb (94.8 kg), last menstrual period 04/26/2023.  [id number]   Long history of heavy long painful menstrual periods Contolled/synchonized with megestrol high dose taper  Now using 21/7 megestrol to manage long term It is working well, bleeding 4 days, no heavy clots and minimal pain Continue with this regimen as it is working well  MEDS ordered this encounter: Meds ordered this encounter  Medications   megestrol (MEGACE) 40 MG tablet    Sig: 1 tablet daily for 21 days, then off 7 days(mimic progesterone only birth control pill cycling)    Dispense:  21 tablet    Refill:  12    Orders for this encounter: No orders of the defined types were placed in this encounter.   Impression + Management Plan   ICD-10-CM   1. Menorrhagia with regular cycle  N92.0    continue 21/7 megestrol management    2. Dysmenorrhea  N94.6       Follow Up: No follow-ups on file.     All questions were answered.  Past Medical History:  Diagnosis Date   Hyperlipidemia    Type 2 diabetes mellitus (HCC)     History reviewed. No pertinent surgical history.  OB History     Gravida  1   Para  1   Term      Preterm  1   AB      Living  1      SAB      IAB      Ectopic      Multiple      Live Births              Allergies  Allergen Reactions   Penicillins Hives    Has patient had a PCN reaction causing immediate rash, facial/tongue/throat swelling, SOB or lightheadedness with hypotension: Yes Has patient had a PCN reaction causing severe rash involving mucus membranes or skin necrosis: No Has patient had a PCN reaction that required hospitalization No Has patient had a PCN reaction occurring within the last 10 years: No If all of the above answers are  "NO", then may proceed with Cephalosporin use.     Social History   Socioeconomic History   Marital status: Single    Spouse name: Not on file   Number of children: Not on file   Years of education: Not on file   Highest education level: 12th grade  Occupational History   Not on file  Tobacco Use   Smoking status: Every Day    Current packs/day: 0.25    Average packs/day: 0.3 packs/day for 15.0 years (3.8 ttl pk-yrs)    Types: Cigarettes   Smokeless tobacco: Never  Vaping Use   Vaping status: Never Used  Substance and Sexual Activity   Alcohol use: Yes    Alcohol/week: 0.0 standard drinks of alcohol    Comment: occasional   Drug use: Not Currently    Types: Marijuana    Comment: 2 weeks ago   Sexual activity: Yes    Birth control/protection: None  Other Topics Concern   Not on file  Social History Narrative   Not on file   Social Determinants of Health   Financial Resource Strain: Low Risk  (03/29/2023)   Overall Financial Resource Strain (CARDIA)  Difficulty of Paying Living Expenses: Not hard at all  Food Insecurity: No Food Insecurity (03/29/2023)   Hunger Vital Sign    Worried About Running Out of Food in the Last Year: Never true    Ran Out of Food in the Last Year: Never true  Recent Concern: Food Insecurity - Food Insecurity Present (01/25/2023)   Hunger Vital Sign    Worried About Running Out of Food in the Last Year: Sometimes true    Ran Out of Food in the Last Year: Never true  Transportation Needs: No Transportation Needs (03/29/2023)   PRAPARE - Administrator, Civil Service (Medical): No    Lack of Transportation (Non-Medical): No  Physical Activity: Insufficiently Active (03/29/2023)   Exercise Vital Sign    Days of Exercise per Week: 4 days    Minutes of Exercise per Session: 30 min  Stress: Stress Concern Present (01/25/2023)   [employer] of Occupational Health - Occupational Stress Questionnaire    Feeling of Stress : To some  extent  Social Connections: Moderately Isolated (03/29/2023)   Social Connection and Isolation Panel [NHANES]    Frequency of Communication with Friends and Family: Three times a week    Frequency of Social Gatherings with Friends and Family: Twice a week    Attends Religious Services: 1 to 4 times per year    Active Member of [employer] or Organizations: No    Attends Engineer, structural: Never    Marital Status: Never married    Family History  Problem Relation Age of Onset   Hypertension Mother    Hyperlipidemia Mother    Diabetes Mother    Hypertension Father    Hyperlipidemia Father    Diabetes Father    Asthma Son    Diabetes Maternal Grandmother    Diabetes Paternal Actor

## 2023-06-08 ENCOUNTER — Other Ambulatory Visit: Payer: Self-pay | Admitting: Family Medicine

## 2023-06-08 DIAGNOSIS — E1165 Type 2 diabetes mellitus with hyperglycemia: Secondary | ICD-10-CM

## 2023-06-20 ENCOUNTER — Other Ambulatory Visit: Payer: Self-pay | Admitting: Family Medicine

## 2023-06-20 DIAGNOSIS — E119 Type 2 diabetes mellitus without complications: Secondary | ICD-10-CM

## 2023-07-07 ENCOUNTER — Encounter: Payer: Self-pay | Admitting: Family Medicine

## 2023-07-07 ENCOUNTER — Ambulatory Visit: Payer: Medicaid Other | Admitting: Family Medicine

## 2023-07-07 VITALS — BP 135/83 | HR 84 | Resp 16 | Ht 64.5 in | Wt 203.1 lb

## 2023-07-07 DIAGNOSIS — E119 Type 2 diabetes mellitus without complications: Secondary | ICD-10-CM | POA: Diagnosis not present

## 2023-07-07 DIAGNOSIS — Z7984 Long term (current) use of oral hypoglycemic drugs: Secondary | ICD-10-CM

## 2023-07-07 DIAGNOSIS — E1165 Type 2 diabetes mellitus with hyperglycemia: Secondary | ICD-10-CM

## 2023-07-07 DIAGNOSIS — E1169 Type 2 diabetes mellitus with other specified complication: Secondary | ICD-10-CM | POA: Diagnosis not present

## 2023-07-07 DIAGNOSIS — E785 Hyperlipidemia, unspecified: Secondary | ICD-10-CM

## 2023-07-07 MED ORDER — TRULICITY 3 MG/0.5ML ~~LOC~~ SOAJ: 3.0000 mg | SUBCUTANEOUS | 2 refills | Status: DC

## 2023-07-07 NOTE — Assessment & Plan Note (Addendum)
Last Hemoglobin A1C 8.7  Labs ordered today awaiting results will follow up. Patient reports taking  Metformin 1000 mg twice daily and Trulicity 1.5 mg injection once a week.Discussed medication desired effects, potential side effects, and how to administer the medication. Nonpharmacological interventions such as low carb diet,high in protein, vegetables and fruit discussed. Educated on importance of physical activity 150 minutes per week. Discussed signs and symptoms of hypoglycemia, & hyperglycemia and need to present to the ED if symptoms occurs.Follow up in 3 months or sooner if needed. Patient verbalizes understanding regarding plan of care and all questions answered. Ophthalmology referral placed , Foot exam within desired limits  Increased Trulicity 3 mg injection once a week.  LDL 51 at goal

## 2023-07-07 NOTE — Patient Instructions (Addendum)

## 2023-07-07 NOTE — Progress Notes (Signed)
Patient Office Visit   Subjective   Patient ID: Brittney Caldwell, female    DOB: 08/22/1981  Age: 42 y.o. MRN: [id number]  CC:  Chief Complaint  Patient presents with   Diabetes    Follow up visit - she takes metformin bid and the am dose is fine but the pm dose makes her have cramps and makes her have to run to the bathroom in the middle of the night     HPI Brittney Caldwell 42 year old female, presents to the clinic for chronic follow up . She  has a past medical history of Hyperlipidemia and Type 2 diabetes mellitus (HCC).  Diabetes She presents for her follow-up diabetic visit. She has type 2 diabetes mellitus. Her disease course has been improving. Pertinent negatives for hypoglycemia include no dizziness, pallor or tremors. Pertinent negatives for diabetes include no blurred vision, no chest pain, no fatigue, no foot paresthesias, no polydipsia, no polyphagia and no polyuria. There are no hypoglycemic complications. Symptoms are improving. There are no diabetic complications. Risk factors for coronary artery disease include dyslipidemia and family history. Current diabetic treatment includes oral agent and Trulicity weekly injections. She is following a generally healthy diet.        Outpatient Encounter Medications as of 07/07/2023  Medication Sig   Accu-Chek Softclix Lancets lancets SMARTSIG:Topical   Blood Glucose Monitoring Suppl (ACCU-CHEK GUIDE ME) w/Device KIT USE TO CHECK BLOOD GLUCOSE THREE TIMES DAILY   Blood Glucose Monitoring Suppl DEVI 1 each by Does not apply route in the morning, at noon, and at bedtime. May substitute to any manufacturer covered by patient's insurance.   Dulaglutide (TRULICITY) 3 MG/0.5ML SOPN Inject 3 mg as directed once a week.   ibuprofen (ADVIL,MOTRIN) 200 MG tablet Take 400 mg by mouth every 6 (six) hours as needed.   Lancets Misc. (ACCU-CHEK SOFTCLIX LANCET DEV) KIT USE TO CHECK BLOOD GLUCOSE THREE TIMES DAILY   megestrol (MEGACE) 40 MG tablet 1  tablet daily for 21 days, then off 7 days(mimic progesterone only birth control pill cycling)   metFORMIN (GLUCOPHAGE) 1000 MG tablet TAKE 1 TABLET(1000 MG) BY MOUTH TWICE DAILY WITH A MEAL   rosuvastatin (CRESTOR) 5 MG tablet Take 1 tablet (5 mg total) by mouth daily.   [DISCONTINUED] glimepiride (AMARYL) 1 MG tablet TAKE 1 TABLET(1 MG) BY MOUTH DAILY WITH BREAKFAST   [DISCONTINUED] TRULICITY 1.5 MG/0.5ML SOPN ADMINISTER 1.5 MG UNDER THE SKIN 1 TIME A WEEK   No facility-administered encounter medications on file as of 07/07/2023.    No past surgical history on file.  Review of Systems  Constitutional:  Negative for chills and fever.  Eyes:  Negative for blurred vision.  Respiratory:  Negative for shortness of breath.   Cardiovascular:  Negative for chest pain.  Gastrointestinal:  Negative for abdominal pain.  Genitourinary:  Negative for dysuria.  Musculoskeletal:  Negative for myalgias.  Neurological:  Negative for dizziness, tremors and headaches.      Objective    BP 135/83   Pulse 84   Resp 16   Ht 5' 4.5" (1.638 m)   Wt 203 lb 1.9 oz (92.1 kg)   SpO2 98%   BMI 34.33 kg/m   Physical Exam Vitals reviewed.  Constitutional:      General: She is not in acute distress.    Appearance: Normal appearance. She is not ill-appearing, toxic-appearing or diaphoretic.  HENT:     Head: Normocephalic.  Eyes:     General:  Right eye: No discharge.        Left eye: No discharge.     Conjunctiva/sclera: Conjunctivae normal.  Cardiovascular:     Rate and Rhythm: Normal rate.     Pulses: Normal pulses.     Heart sounds: Normal heart sounds.  Pulmonary:     Effort: Pulmonary effort is normal. No respiratory distress.     Breath sounds: Normal breath sounds.  Musculoskeletal:        General: Normal range of motion.     Cervical back: Normal range of motion.  Skin:    General: Skin is warm and dry.     Capillary Refill: Capillary refill takes less than 2 seconds.   Neurological:     General: No focal deficit present.     Mental Status: She is alert and oriented to person, place, and time.     Coordination: Coordination normal.     Gait: Gait normal.  Psychiatric:        Mood and Affect: Mood normal.        Behavior: Behavior normal.       Assessment & Plan:  Hyperlipidemia, unspecified hyperlipidemia type -     Lipid panel -     CMP14+EGFR  Type 2 diabetes mellitus with hyperglycemia, without long-term current use of insulin (HCC) -     Hemoglobin A1c  Hyperlipidemia associated with type 2 diabetes mellitus (HCC) Assessment & Plan: Last Hemoglobin A1C 8.7  Labs ordered today awaiting results will follow up. Patient reports taking  Metformin 1000 mg twice daily and Trulicity 1.5 mg injection once a week.Discussed medication desired effects, potential side effects, and how to administer the medication. Nonpharmacological interventions such as low carb diet,high in protein, vegetables and fruit discussed. Educated on importance of physical activity 150 minutes per week. Discussed signs and symptoms of hypoglycemia, & hyperglycemia and need to present to the ED if symptoms occurs.Follow up in 3 months or sooner if needed. Patient verbalizes understanding regarding plan of care and all questions answered. Ophthalmology referral placed , Foot exam within desired limits  Increased Trulicity 3 mg injection once a week.  LDL 51 at goal   Diabetic eye exam [facility]) -     Ambulatory referral to Ophthalmology  Other orders -     Trulicity; Inject 3 mg as directed once a week.  Dispense: 2 mL; Refill: 2    Return in about 4 months (around 11/06/2023), or if symptoms worsen or fail to improve, for hyperlipidemia, type 2 diabetes, routine labs.   Cruzita Lederer Newman Nip, FNP

## 2023-07-26 DIAGNOSIS — E785 Hyperlipidemia, unspecified: Secondary | ICD-10-CM | POA: Diagnosis not present

## 2023-07-26 DIAGNOSIS — E1165 Type 2 diabetes mellitus with hyperglycemia: Secondary | ICD-10-CM | POA: Diagnosis not present

## 2023-07-27 LAB — CMP14+EGFR
ALT: 13 IU/L (ref 0–32)
AST: 14 IU/L (ref 0–40)
Albumin: 4.1 g/dL (ref 3.9–4.9)
Alkaline Phosphatase: 67 IU/L (ref 44–121)
BUN/Creatinine Ratio: 8 — ABNORMAL LOW (ref 9–23)
BUN: 6 mg/dL (ref 6–24)
Bilirubin Total: 0.3 mg/dL (ref 0.0–1.2)
CO2: 20 mmol/L (ref 20–29)
Calcium: 9.7 mg/dL (ref 8.7–10.2)
Chloride: 106 mmol/L (ref 96–106)
Creatinine, Ser: 0.77 mg/dL (ref 0.57–1.00)
Globulin, Total: 2.1 g/dL (ref 1.5–4.5)
Glucose: 191 mg/dL — ABNORMAL HIGH (ref 70–99)
Potassium: 4.4 mmol/L (ref 3.5–5.2)
Sodium: 139 mmol/L (ref 134–144)
Total Protein: 6.2 g/dL (ref 6.0–8.5)
eGFR: 99 mL/min/{1.73_m2} (ref >59)

## 2023-07-27 LAB — LIPID PANEL
Chol/HDL Ratio: 2.8 ratio (ref 0.0–4.4)
Cholesterol, Total: 115 mg/dL (ref 100–199)
HDL: 41 mg/dL (ref >39)
LDL Chol Calc (NIH): 60 mg/dL (ref 0–99)
Triglycerides: 68 mg/dL (ref 0–149)
VLDL Cholesterol Cal: 14 mg/dL (ref 5–40)

## 2023-07-27 LAB — HEMOGLOBIN A1C
Est. average glucose Bld gHb Est-mCnc: 197 mg/dL
Hgb A1c MFr Bld: 8.5 % — ABNORMAL HIGH (ref 4.8–5.6)

## 2023-08-09 ENCOUNTER — Other Ambulatory Visit: Payer: Self-pay | Admitting: Family Medicine

## 2023-08-09 DIAGNOSIS — E1165 Type 2 diabetes mellitus with hyperglycemia: Secondary | ICD-10-CM

## 2023-10-03 ENCOUNTER — Other Ambulatory Visit: Payer: Self-pay | Admitting: Family Medicine

## 2023-11-04 NOTE — Progress Notes (Signed)
Established Patient Office Visit   Subjective  Patient ID: Brittney Caldwell, female    DOB: 02-14-1981  Age: 42 y.o. MRN: [id number]  Chief Complaint  Patient presents with   Follow-up    4 follow up. hyperlipidemia, type 2 diabetes, routine labs.    She  has a past medical history of Hyperlipidemia and Type 2 diabetes mellitus (HCC).  Diabetes She presents for her follow-up diabetic visit. She has type 2 diabetes mellitus. Her disease course has been improving. Pertinent negatives for hypoglycemia include no dizziness or tremors. Pertinent negatives for diabetes include no blurred vision, no chest pain and no visual change. There are no hypoglycemic complications. Pertinent negatives for diabetic complications include no peripheral neuropathy. Risk factors for coronary artery disease include dyslipidemia and obesity. Current diabetic treatment includes diet and oral agent (monotherapy) (Trulicity weekly injection). She is following a generally healthy diet. Meal planning includes avoidance of concentrated sweets. She rarely participates in exercise. She does not see a podiatrist.Eye exam is not current.    Review of Systems  Constitutional:  Negative for chills and fever.  Eyes:  Negative for blurred vision.  Respiratory:  Negative for shortness of breath.   Cardiovascular:  Negative for chest pain.  Gastrointestinal:  Negative for abdominal pain.  Neurological:  Negative for dizziness and tremors.      Objective:     BP 131/87   Pulse (!) 106   Ht 5' 4.5" (1.638 m)   Wt 198 lb 0.6 oz (89.8 kg)   LMP 11/03/2023 (Exact Date)   SpO2 98%   BMI 33.47 kg/m  BP Readings from Last 3 Encounters:  11/05/23 131/87  07/07/23 135/83  05/27/23 128/85      Physical Exam Vitals reviewed.  Constitutional:      General: She is not in acute distress.    Appearance: Normal appearance. She is not ill-appearing, toxic-appearing or diaphoretic.  HENT:     Head: Normocephalic.  Eyes:      General:        Right eye: No discharge.        Left eye: No discharge.     Conjunctiva/sclera: Conjunctivae normal.  Cardiovascular:     Rate and Rhythm: Normal rate.     Pulses: Normal pulses.     Heart sounds: Normal heart sounds.  Pulmonary:     Effort: Pulmonary effort is normal. No respiratory distress.     Breath sounds: Normal breath sounds.  Abdominal:     General: Bowel sounds are normal.     Palpations: Abdomen is soft.     Tenderness: There is no abdominal tenderness. There is no right CVA tenderness, left CVA tenderness or guarding.  Musculoskeletal:        General: Normal range of motion.  Skin:    General: Skin is warm and dry.     Capillary Refill: Capillary refill takes less than 2 seconds.  Neurological:     Mental Status: She is alert.     Coordination: Coordination normal.     Gait: Gait normal.  Psychiatric:        Mood and Affect: Mood normal.        Behavior: Behavior normal.      No results found for any visits on 11/05/23.  The ASCVD Risk score (Arnett DK, et al., 2019) failed to calculate for the following reasons:   The valid total cholesterol range is 130 to 320 mg/dL    Assessment & Plan:  Type  2 diabetes mellitus without complication, without long-term current use of insulin (HCC) Assessment & Plan: Last Hemoglobin A1c: 8.5 Labs: Ordered today, results pending; will follow up accordingly. The patient reports adhering to prescribed medications: Metformin 1000 mg once daily and Weekly trulicityinjections   Reviewed non-pharmacological interventions, including a balanced diet rich in lean proteins, healthy fats, whole grains, and high-fiber vegetables. Emphasized reducing refined sugars and processed carbohydrates, and incorporating more fruits, leafy greens, and legumes. Education: Patient was educated on recognizing signs and symptoms of both hypoglycemia and hyperglycemia, and advised to seek emergency care if these symptoms  occur. Follow-Up: Scheduled for follow-up in 3-4 months, or sooner if needed. Patient Understanding: The patient verbalized understanding of the care plan, and all questions were answered. Additional Care: Ophthalmology referral was placed. Foot exam results were within normal limits.   Orders: -     Hemoglobin A1c -     Lipid panel -     BMP8+eGFR -     Ambulatory referral to Ophthalmology -     Trulicity; Inject 4.5 mg into the skin once a week.  Dispense: 2 mL; Refill: 2    Return in about 4 months (around 03/05/2024), or if symptoms worsen or fail to improve, for type 2 diabetes.   Cruzita Lederer Newman Nip, FNP

## 2023-11-04 NOTE — Patient Instructions (Signed)

## 2023-11-05 ENCOUNTER — Ambulatory Visit: Payer: Medicaid Other | Admitting: Family Medicine

## 2023-11-05 ENCOUNTER — Encounter: Payer: Self-pay | Admitting: Family Medicine

## 2023-11-05 VITALS — BP 131/87 | HR 106 | Ht 64.5 in | Wt 198.0 lb

## 2023-11-05 DIAGNOSIS — Z23 Encounter for immunization: Secondary | ICD-10-CM

## 2023-11-05 DIAGNOSIS — E119 Type 2 diabetes mellitus without complications: Secondary | ICD-10-CM

## 2023-11-05 DIAGNOSIS — E1169 Type 2 diabetes mellitus with other specified complication: Secondary | ICD-10-CM

## 2023-11-05 DIAGNOSIS — Z7984 Long term (current) use of oral hypoglycemic drugs: Secondary | ICD-10-CM | POA: Diagnosis not present

## 2023-11-05 MED ORDER — TRULICITY 4.5 MG/0.5ML ~~LOC~~ SOAJ
4.5000 mg | SUBCUTANEOUS | 2 refills | Status: DC
Start: 2023-11-05 — End: 2024-03-14

## 2023-11-05 NOTE — Assessment & Plan Note (Addendum)
Last Hemoglobin A1c: 8.5 Labs: Ordered today, results pending; will follow up accordingly. The patient reports adhering to prescribed medications: Metformin 1000 mg once daily and Weekly trulicityinjections   Reviewed non-pharmacological interventions, including a balanced diet rich in lean proteins, healthy fats, whole grains, and high-fiber vegetables. Emphasized reducing refined sugars and processed carbohydrates, and incorporating more fruits, leafy greens, and legumes. Education: Patient was educated on recognizing signs and symptoms of both hypoglycemia and hyperglycemia, and advised to seek emergency care if these symptoms occur. Follow-Up: Scheduled for follow-up in 3-4 months, or sooner if needed. Patient Understanding: The patient verbalized understanding of the care plan, and all questions were answered. Additional Care: Ophthalmology referral was placed. Foot exam results were within normal limits.

## 2023-11-06 LAB — HEMOGLOBIN A1C
Est. average glucose Bld gHb Est-mCnc: 200 mg/dL
Hgb A1c MFr Bld: 8.6 % — ABNORMAL HIGH (ref 4.8–5.6)

## 2023-11-06 LAB — BMP8+EGFR
BUN/Creatinine Ratio: 10 (ref 9–23)
BUN: 8 mg/dL (ref 6–24)
CO2: 22 mmol/L (ref 20–29)
Calcium: 9.6 mg/dL (ref 8.7–10.2)
Chloride: 104 mmol/L (ref 96–106)
Creatinine, Ser: 0.81 mg/dL (ref 0.57–1.00)
Glucose: 191 mg/dL — ABNORMAL HIGH (ref 70–99)
Potassium: 4.4 mmol/L (ref 3.5–5.2)
Sodium: 139 mmol/L (ref 134–144)
eGFR: 93 mL/min/{1.73_m2} (ref >59)

## 2023-11-06 LAB — LIPID PANEL
Chol/HDL Ratio: 3.6 {ratio} (ref 0.0–4.4)
Cholesterol, Total: 120 mg/dL (ref 100–199)
HDL: 33 mg/dL — ABNORMAL LOW (ref >39)
LDL Chol Calc (NIH): 69 mg/dL (ref 0–99)
Triglycerides: 95 mg/dL (ref 0–149)
VLDL Cholesterol Cal: 18 mg/dL (ref 5–40)

## 2023-11-07 ENCOUNTER — Other Ambulatory Visit: Payer: Self-pay | Admitting: Family Medicine

## 2023-11-07 MED ORDER — GLIPIZIDE 10 MG PO TABS: 10.0000 mg | ORAL_TABLET | Freq: Every day | ORAL | 3 refills | Status: DC

## 2024-01-11 ENCOUNTER — Other Ambulatory Visit: Payer: Self-pay | Admitting: Family Medicine

## 2024-01-11 DIAGNOSIS — E785 Hyperlipidemia, unspecified: Secondary | ICD-10-CM

## 2024-01-26 ENCOUNTER — Ambulatory Visit: Payer: Self-pay | Admitting: Family Medicine

## 2024-01-26 NOTE — Telephone Encounter (Signed)
  Chief Complaint: URI Symptoms: cough, sore throat, body aches,  Frequency: 2-3 days ago Pertinent Negatives: Patient denies covid vaccine, sob,  Disposition: [] ED /[] Urgent Care (no appt availability in office) / [] Appointment(In office/virtual)/ []  [facility]/ [x] Home Care/ [x] Refused Recommended Disposition /[] [facility]/ []  Follow-up with PCP  Additional Notes: Pt states that the day before yesterday she started coughing. Pt states she has also developed sore throat, chills, and aches. Pt states she got her flu vaccine this year but not covid. Denies checking her temperature. Pt states that she has not taken OTC medications with exception of one dose of dayquill yesterday. Pt requesting appt. Avail appts at other [facility] today, pt declined, only wanted to see her PCP. Pt also offered VV-pt declined. Scheduled for tomorrow in person with pcp. Pt states that she is diabetic and only wants to see someone that knows her. Pt advised of home care instructions per epic, pt again states "I'm diabetic".   Copied from CRM [id number]. Topic: Clinical - Red Word Triage >> Jan 26, 2024  8:25 AM Elle L wrote: Red Word that prompted transfer to Nurse Triage: The patient has a worsening cough and sore throat. She states she is pouring sweat and was hot and cold all night last night and has body aches and pains. She feels like she has a fever but is unable to check it. Reason for Disposition  Care advice for mild cough, questions about  Answer Assessment - Initial Assessment Questions 1. ONSET: "When did the nasal discharge start?"      2-3 days 3. COUGH: "Do you have a cough?" If Yes, ask: "Describe the color of your sputum" (clear, white, yellow, green)     Pt states that cough is moderate, pt coughing up minimal phlegm-mild yellow color 4. RESPIRATORY DISTRESS: "Describe your breathing."      denies 5. FEVER: "Do you have a fever?" If Yes, ask: "What is your temperature, how was it  measured, and when did it start?"     Unsure, haven't checked it, has chills, sweats and body aches 6. SEVERITY: "Overall, how bad are you feeling right now?" (e.g., doesn't interfere with normal activities, staying home from school/work, staying in bed)      Called in for work 7. OTHER SYMPTOMS: "Do you have any other symptoms?" (e.g., sore throat, earache, wheezing, vomiting)     Sore throat, HA 8. PREGNANCY: "Is there any chance you are pregnant?" "When was your last menstrual period?"     denies  Protocols used: Common Cold-A-AH

## 2024-01-27 ENCOUNTER — Ambulatory Visit: Payer: Self-pay | Admitting: Family Medicine

## 2024-01-27 ENCOUNTER — Encounter: Payer: Self-pay | Admitting: Family Medicine

## 2024-01-27 VITALS — BP 137/79 | HR 113 | Ht 64.5 in | Wt 200.2 lb

## 2024-01-27 DIAGNOSIS — B349 Viral infection, unspecified: Secondary | ICD-10-CM

## 2024-01-27 MED ORDER — LEVOCETIRIZINE DIHYDROCHLORIDE 5 MG PO TABS
5.0000 mg | ORAL_TABLET | Freq: Every evening | ORAL | 2 refills | Status: AC
Start: 2024-01-27 — End: ?

## 2024-01-27 MED ORDER — PROMETHAZINE-DM 6.25-15 MG/5ML PO SYRP
5.0000 mL | ORAL_SOLUTION | Freq: Four times a day (QID) | ORAL | 0 refills | Status: DC | PRN
Start: 2024-01-27 — End: 2024-02-22

## 2024-01-27 MED ORDER — AZELASTINE-FLUTICASONE 137-50 MCG/ACT NA SUSP
1.0000 | Freq: Two times a day (BID) | NASAL | 1 refills | Status: DC
Start: 2024-01-27 — End: 2024-03-08

## 2024-01-27 NOTE — Progress Notes (Signed)
 Established Patient Office Visit   Subjective  Patient ID: Brittney Caldwell, female    DOB: 06/03/1981  Age: 43 y.o. MRN: [id number]  Chief Complaint  Patient presents with   Acute Visit    Coughing, sneezing , congestion, body aches, fever at night, headaches, chills, runny nose, sore throat otc not helping all since Tuesday     She  has a past medical history of Hyperlipidemia and Type 2 diabetes mellitus (HCC).  Patient complains of persistent cough. Patient describes symptoms of fatigue, fever, nasal congestion,  headache, malaise, myalgias, sore throat, sputum production, and sweats. Symptoms began 2 days ago and are gradually worsening since that time. Patient denies dyspnea, chest pain, or nausea and vomiting. Treatment thus far includes OTC analgesics/antipyretics: somewhat effective Past pulmonary history is significant for no history of pneumonia or bronchitis     Review of Systems  Constitutional:  Positive for chills, diaphoresis, fever and malaise/fatigue.  HENT:  Positive for congestion and sore throat.   Eyes:  Negative for blurred vision.  Respiratory:  Positive for cough and sputum production. Negative for shortness of breath.   Cardiovascular:  Negative for chest pain.  Gastrointestinal:  Negative for abdominal pain.  Genitourinary:  Positive for dysuria.  Neurological:  Positive for headaches.      Objective:     BP 137/79   Pulse (!) 113   Ht 5' 4.5" (1.638 m)   Wt 200 lb 3.2 oz (90.8 kg)   LMP 01/10/2024 (Approximate)   SpO2 98%   BMI 33.83 kg/m  BP Readings from Last 3 Encounters:  01/27/24 137/79  11/05/23 131/87  07/07/23 135/83      Physical Exam Vitals reviewed.  Constitutional:      General: She is not in acute distress.    Appearance: Normal appearance. She is not ill-appearing, toxic-appearing or diaphoretic.  HENT:     Head: Normocephalic.     Right Ear: Tympanic membrane normal.     Left Ear: Tympanic membrane normal.     Nose:  Congestion and rhinorrhea present.     Mouth/Throat:     Pharynx: Posterior oropharyngeal erythema present.  Eyes:     General:        Right eye: No discharge.        Left eye: No discharge.     Conjunctiva/sclera: Conjunctivae normal.  Cardiovascular:     Rate and Rhythm: Normal rate.     Pulses: Normal pulses.     Heart sounds: Normal heart sounds.  Pulmonary:     Effort: Pulmonary effort is normal. No respiratory distress.     Breath sounds: Normal breath sounds.  Musculoskeletal:     Cervical back: Normal range of motion.  Lymphadenopathy:     Cervical: Cervical adenopathy present.  Skin:    General: Skin is warm and dry.     Capillary Refill: Capillary refill takes less than 2 seconds.  Neurological:     Mental Status: She is alert.  Psychiatric:        Mood and Affect: Mood normal.        Behavior: Behavior normal.      No results found for any visits on 01/27/24.  The ASCVD Risk score (Arnett DK, et al., 2019) failed to calculate for the following reasons:   The valid total cholesterol range is 130 to 320 mg/dL    Assessment & Plan:  Viral illness Assessment & Plan: COVID-19, Flu A+B RSV  ordered   Azelastine-fluticasone nasal  spray PRN,  promethazine syrup PRN, Xyzal 5 mg at bedtime. Advise patient to rest to support your body's recovery. Stay hydrated by drinking water, tea, or broth. Using a humidifier can help soothe throat irritation and ease nasal congestion. For fever or pain, acetaminophen (Tylenol) is recommended. To relieve other symptoms, try saline nasal sprays, throat lozenges, or gargling with saltwater. Focus on eating light, healthy meals like fruits and vegetables to keep your strength up. Practice good hygiene by washing your hands frequently and covering your mouth when coughing or sneezing.Follow-up for worsening or persistent symptoms. Patient verbalizes understanding regarding plan of care and all questions answered     Orders: -      Promethazine-DM; Take 5 mLs by mouth 4 (four) times daily as needed.  Dispense: 118 mL; Refill: 0 -     Levocetirizine Dihydrochloride; Take 1 tablet (5 mg total) by mouth every evening.  Dispense: 30 tablet; Refill: 2 -     Azelastine-Fluticasone; Place 1 spray into the nose every 12 (twelve) hours.  Dispense: 23 g; Refill: 1 -     COVID-19, Flu A+B and RSV    Return if symptoms worsen or fail to improve.   Cruzita Lederer Newman Nip, FNP

## 2024-01-27 NOTE — Patient Instructions (Signed)

## 2024-01-27 NOTE — Assessment & Plan Note (Signed)
 COVID-19, Flu A+B RSV  ordered   Azelastine-fluticasone nasal spray PRN,  promethazine syrup PRN, Xyzal 5 mg at bedtime. Advise patient to rest to support your body's recovery. Stay hydrated by drinking water, tea, or broth. Using a humidifier can help soothe throat irritation and ease nasal congestion. For fever or pain, acetaminophen (Tylenol) is recommended. To relieve other symptoms, try saline nasal sprays, throat lozenges, or gargling with saltwater. Focus on eating light, healthy meals like fruits and vegetables to keep your strength up. Practice good hygiene by washing your hands frequently and covering your mouth when coughing or sneezing.Follow-up for worsening or persistent symptoms. Patient verbalizes understanding regarding plan of care and all questions answered

## 2024-01-29 LAB — COVID-19, FLU A+B AND RSV
Influenza A, NAA: DETECTED — AB
Influenza B, NAA: NOT DETECTED
RSV, NAA: NOT DETECTED
SARS-CoV-2, NAA: NOT DETECTED

## 2024-02-01 ENCOUNTER — Other Ambulatory Visit: Payer: Self-pay | Admitting: Family Medicine

## 2024-02-01 ENCOUNTER — Encounter: Payer: Self-pay | Admitting: Family Medicine

## 2024-02-01 MED ORDER — OSELTAMIVIR PHOSPHATE 75 MG PO CAPS: 75.0000 mg | ORAL_CAPSULE | Freq: Two times a day (BID) | ORAL | 0 refills | Status: AC

## 2024-02-01 NOTE — Progress Notes (Unsigned)
 tami

## 2024-02-02 ENCOUNTER — Other Ambulatory Visit (HOSPITAL_COMMUNITY): Payer: Self-pay | Admitting: Obstetrics & Gynecology

## 2024-02-02 DIAGNOSIS — Z1231 Encounter for screening mammogram for malignant neoplasm of breast: Secondary | ICD-10-CM

## 2024-02-07 ENCOUNTER — Ambulatory Visit (HOSPITAL_COMMUNITY)
Admission: RE | Admit: 2024-02-07 | Discharge: 2024-02-07 | Disposition: A | Discharge status: Home / Self Care | Source: Ambulatory Visit | Attending: Obstetrics & Gynecology | Admitting: Obstetrics & Gynecology

## 2024-02-07 DIAGNOSIS — Z1231 Encounter for screening mammogram for malignant neoplasm of breast: Secondary | ICD-10-CM | POA: Insufficient documentation

## 2024-02-09 ENCOUNTER — Ambulatory Visit (HOSPITAL_COMMUNITY)

## 2024-02-16 ENCOUNTER — Encounter (HOSPITAL_COMMUNITY): Payer: Self-pay

## 2024-02-16 ENCOUNTER — Ambulatory Visit (HOSPITAL_COMMUNITY)
Admission: RE | Admit: 2024-02-16 | Discharge: 2024-02-16 | Disposition: A | Discharge status: Home / Self Care | Source: Ambulatory Visit | Attending: Obstetrics & Gynecology | Admitting: Obstetrics & Gynecology

## 2024-02-16 DIAGNOSIS — Z1231 Encounter for screening mammogram for malignant neoplasm of breast: Secondary | ICD-10-CM | POA: Diagnosis not present

## 2024-02-22 ENCOUNTER — Encounter: Payer: Self-pay | Admitting: Obstetrics & Gynecology

## 2024-02-22 ENCOUNTER — Ambulatory Visit: Admitting: Obstetrics & Gynecology

## 2024-02-22 VITALS — BP 135/88 | HR 98 | Ht 64.5 in | Wt 203.0 lb

## 2024-02-22 DIAGNOSIS — N920 Excessive and frequent menstruation with regular cycle: Secondary | ICD-10-CM

## 2024-02-22 DIAGNOSIS — Z113 Encounter for screening for infections with a predominantly sexual mode of transmission: Secondary | ICD-10-CM | POA: Diagnosis not present

## 2024-02-22 DIAGNOSIS — Z01419 Encounter for gynecological examination (general) (routine) without abnormal findings: Secondary | ICD-10-CM | POA: Diagnosis not present

## 2024-02-22 DIAGNOSIS — N946 Dysmenorrhea, unspecified: Secondary | ICD-10-CM | POA: Diagnosis not present

## 2024-02-22 DIAGNOSIS — Z30014 Encounter for initial prescription of intrauterine contraceptive device: Secondary | ICD-10-CM | POA: Diagnosis not present

## 2024-02-22 MED ORDER — LEVONORGESTREL 20 MCG/DAY IU IUD
1.0000 | INTRAUTERINE_SYSTEM | Freq: Once | INTRAUTERINE | Status: AC
  Administered 2024-02-22: 1 via INTRAUTERINE

## 2024-02-22 MED ORDER — MEGESTROL ACETATE 40 MG PO TABS: ORAL_TABLET | ORAL | 12 refills | Status: DC

## 2024-02-22 NOTE — Progress Notes (Signed)
 Subjective:     Brittney Caldwell is a 43 y.o. female here for a routine exam.  Patient's last menstrual period was 02/11/2024 (approximate). G1P0101 Birth Control Method:  cyclical megestrol(really for cycle control) she is abstinant Menstrual Calendar(currently): regular still heavy with clots 10 days  Current complaints: periods, better on megestrol.   Current acute medical issues:  none   Recent Gynecologic History Patient's last menstrual period was 02/11/2024 (approximate). Last Pap: 2024,  normal Last mammogram: 4/25,  normal  Past Medical History:  Diagnosis Date   Hyperlipidemia    Type 2 diabetes mellitus (HCC)     History reviewed. No pertinent surgical history.  OB History     Gravida  1   Para  1   Term      Preterm  1   AB      Living  1      SAB      IAB      Ectopic      Multiple      Live Births              Social History   Socioeconomic History   Marital status: Single    Spouse name: Not on file   Number of children: Not on file   Years of education: Not on file   Highest education level: Some college, no degree  Occupational History   Not on file  Tobacco Use   Smoking status: Every Day    Current packs/day: 0.25    Average packs/day: 0.3 packs/day for 15.0 years (3.8 ttl pk-yrs)    Types: Cigarettes   Smokeless tobacco: Never  Vaping Use   Vaping status: Never Used  Substance and Sexual Activity   Alcohol use: Yes    Alcohol/week: 0.0 standard drinks of alcohol    Comment: occasional   Drug use: Not Currently    Types: Marijuana    Comment: 2 weeks ago   Sexual activity: Yes    Birth control/protection: None  Other Topics Concern   Not on file  Social History Narrative   Not on file   Social Drivers of Health   Financial Resource Strain: Low Risk  (02/22/2024)   Overall Financial Resource Strain (CARDIA)    Difficulty of Paying Living Expenses: Not very hard  Food Insecurity: No Food Insecurity (02/22/2024)    Hunger Vital Sign    Worried About Running Out of Food in the Last Year: Never true    Ran Out of Food in the Last Year: Never true  Transportation Needs: No Transportation Needs (02/22/2024)   PRAPARE - Administrator, Civil Service (Medical): No    Lack of Transportation (Non-Medical): No  Physical Activity: Insufficiently Active (02/22/2024)   Exercise Vital Sign    Days of Exercise per Week: 2 days    Minutes of Exercise per Session: 30 min  Stress: No Stress Concern Present (02/22/2024)   [employer] of Occupational Health - Occupational Stress Questionnaire    Feeling of Stress : Only a little  Social Connections: Moderately Isolated (02/22/2024)   Social Connection and Isolation Panel [NHANES]    Frequency of Communication with Friends and Family: Twice a week    Frequency of Social Gatherings with Friends and Family: Twice a week    Attends Religious Services: 1 to 4 times per year    Active Member of [employer] or Organizations: No    Attends Banker Meetings: Never  Marital Status: Never married    Family History  Problem Relation Age of Onset   Hypertension Mother    Hyperlipidemia Mother    Diabetes Mother    Hypertension Father    Hyperlipidemia Father    Diabetes Father    Asthma Son    Diabetes Maternal Grandmother    Diabetes Paternal Grandfather      Current Outpatient Medications:    Accu-Chek Softclix Lancets lancets, SMARTSIG:Topical, Disp: , Rfl:    Azelastine-Fluticasone 137-50 MCG/ACT SUSP, Place 1 spray into the nose every 12 (twelve) hours., Disp: 23 g, Rfl: 1   Blood Glucose Monitoring Suppl (ACCU-CHEK GUIDE ME) w/Device KIT, USE TO CHECK BLOOD GLUCOSE THREE TIMES DAILY, Disp: , Rfl:    Blood Glucose Monitoring Suppl DEVI, 1 each by Does not apply route in the morning, at noon, and at bedtime. May substitute to any manufacturer covered by patient's insurance., Disp: 1 each, Rfl: 0   Dulaglutide (TRULICITY) 4.5 MG/0.5ML SOAJ,  Inject 4.5 mg into the skin once a week., Disp: 2 mL, Rfl: 2   glipiZIDE (GLUCOTROL) 10 MG tablet, Take 1 tablet (10 mg total) by mouth daily., Disp: 30 tablet, Rfl: 3   ibuprofen (ADVIL,MOTRIN) 200 MG tablet, Take 400 mg by mouth every 6 (six) hours as needed., Disp: , Rfl:    Lancets Misc. (ACCU-CHEK SOFTCLIX LANCET DEV) KIT, USE TO CHECK BLOOD GLUCOSE THREE TIMES DAILY, Disp: , Rfl:    levocetirizine (XYZAL) 5 MG tablet, Take 1 tablet (5 mg total) by mouth every evening., Disp: 30 tablet, Rfl: 2   metFORMIN (GLUCOPHAGE) 1000 MG tablet, TAKE 1 TABLET(1000 MG) BY MOUTH TWICE DAILY WITH A MEAL, Disp: 90 tablet, Rfl: 3   rosuvastatin (CRESTOR) 5 MG tablet, TAKE 1 TABLET(5 MG) BY MOUTH DAILY, Disp: 90 tablet, Rfl: 3   megestrol (MEGACE) 40 MG tablet, 1 tablet daily for 24 days, then off 4 days(mimic progesterone only birth control pill cycling), Disp: 24 tablet, Rfl: 12  Review of Systems  Review of Systems  Constitutional: Negative for fever, chills, weight loss, malaise/fatigue and diaphoresis.  HENT: Negative for hearing loss, ear pain, nosebleeds, congestion, sore throat, neck pain, tinnitus and ear discharge.   Eyes: Negative for blurred vision, double vision, photophobia, pain, discharge and redness.  Respiratory: Negative for cough, hemoptysis, sputum production, shortness of breath, wheezing and stridor.   Cardiovascular: Negative for chest pain, palpitations, orthopnea, claudication, leg swelling and PND.  Gastrointestinal: negative for abdominal pain. Negative for heartburn, nausea, vomiting, diarrhea, constipation, blood in stool and melena.  Genitourinary: Negative for dysuria, urgency, frequency, hematuria and flank pain.  Musculoskeletal: Negative for myalgias, back pain, joint pain and falls.  Skin: Negative for itching and rash.  Neurological: Negative for dizziness, tingling, tremors, sensory change, speech change, focal weakness, seizures, loss of consciousness, weakness and  headaches.  Endo/Heme/Allergies: Negative for environmental allergies and polydipsia. Does not bruise/bleed easily.  Psychiatric/Behavioral: Negative for depression, suicidal ideas, hallucinations, memory loss and substance abuse. The patient is not nervous/anxious and does not have insomnia.        Objective:  Blood pressure 135/88, pulse 98, height 5' 4.5" (1.638 m), weight 203 lb (92.1 kg), last menstrual period 02/11/2024.   Physical Exam  Vitals reviewed. Constitutional: She is oriented to person, place, and time. She appears well-developed and well-nourished.  HENT:  Head: Normocephalic and atraumatic.        Right Ear: External ear normal.  Left Ear: External ear normal.  Nose: Nose normal.  Mouth/Throat: Oropharynx is clear and moist.  Eyes: Conjunctivae and EOM are normal. Pupils are equal, round, and reactive to light. Right eye exhibits no discharge. Left eye exhibits no discharge. No scleral icterus.  Neck: Normal range of motion. Neck supple. No tracheal deviation present. No thyromegaly present.  Cardiovascular: Normal rate, regular rhythm, normal heart sounds and intact distal pulses.  Exam reveals no gallop and no friction rub.   No murmur heard. Respiratory: Effort normal and breath sounds normal. No respiratory distress. She has no wheezes. She has no rales. She exhibits no tenderness.  GI: Soft. Bowel sounds are normal. She exhibits no distension and no mass. There is no tenderness. There is no rebound and no guarding.  Genitourinary:  Breasts no masses skin changes or nipple changes bilaterally      Vulva is normal without lesions Vagina is pink moist without discharge Cervix normal in appearance and pap is done Uterus is normal size shape and contour Adnexa is negative with normal sized ovaries   Musculoskeletal: Normal range of motion. She exhibits no edema and no tenderness.  Neurological: She is alert and oriented to person, place, and time. She has normal  reflexes. She displays normal reflexes. No cranial nerve deficit. She exhibits normal muscle tone. Coordination normal.  Skin: Skin is warm and dry. No rash noted. No erythema. No pallor.  Psychiatric: She has a normal mood and affect. Her behavior is normal. Judgment and thought content normal.     IUD Insertion Procedure Note  Pre-operative Diagnosis: Menorrhagia and dysmenorrhea  Post-operative Diagnosis: normal  Indications: contraception  Procedure Details  Urine pregnancy test was not done, pt is on her menses and she is abstinenet.  The risks (including infection, bleeding, pain, and uterine perforation) and benefits of the procedure were explained to the patient and Written informed consent was obtained.    Cervix cleansed with Betadine. Uterus sounded to 8 cm. IUD inserted without difficulty. String visible and trimmed. Patient tolerated procedure well.  IUD Information: Mirena, Lot # [id number], Expiration date 12/2025.  Condition: Stable  Complications: None  Plan:  The patient was advised to call for any fever or for prolonged or severe pain or bleeding. She was advised to use OTC ibuprofen as needed for mild to moderate pain.   Attending Physician Documentation: I placed the IUD  Medications Ordered at today's visit: Meds ordered this encounter  Medications   megestrol (MEGACE) 40 MG tablet    Sig: 1 tablet daily for 24 days, then off 4 days(mimic progesterone only birth control pill cycling)    Dispense:  24 tablet    Refill:  12    Other orders placed at today's visit: No orders of the defined types were placed in this encounter.    ASSESSMENT + PLAN:    ICD-10-CM   1. Well woman exam with routine gynecological exam  Z01.419     2. Screen for STD (sexually transmitted disease)  Z11.3 Cervicovaginal ancillary only( [facility])    3. Menorrhagia with regular cycle  N92.0     4. Dysmenorrhea  N94.6           Return in about 4 months (around  06/23/2024) for MyChart Connect visit, Follow up, with Dr Despina Hidden: discuss menses with IUD + megestrol.

## 2024-02-23 LAB — CERVICOVAGINAL ANCILLARY ONLY
Chlamydia: NEGATIVE
Comment: NEGATIVE
Comment: NEGATIVE
Comment: NORMAL
Neisseria Gonorrhea: NEGATIVE
Trichomonas: NEGATIVE

## 2024-03-07 ENCOUNTER — Other Ambulatory Visit: Payer: Self-pay | Admitting: Family Medicine

## 2024-03-08 ENCOUNTER — Encounter: Payer: Self-pay | Admitting: Family Medicine

## 2024-03-08 ENCOUNTER — Ambulatory Visit: Payer: Medicaid Other | Admitting: Family Medicine

## 2024-03-08 VITALS — BP 112/75 | HR 89 | Ht 64.5 in | Wt 203.0 lb

## 2024-03-08 DIAGNOSIS — E1169 Type 2 diabetes mellitus with other specified complication: Secondary | ICD-10-CM

## 2024-03-08 DIAGNOSIS — E782 Mixed hyperlipidemia: Secondary | ICD-10-CM

## 2024-03-08 DIAGNOSIS — Z23 Encounter for immunization: Secondary | ICD-10-CM | POA: Diagnosis not present

## 2024-03-08 DIAGNOSIS — E559 Vitamin D deficiency, unspecified: Secondary | ICD-10-CM | POA: Diagnosis not present

## 2024-03-08 DIAGNOSIS — E538 Deficiency of other specified B group vitamins: Secondary | ICD-10-CM

## 2024-03-08 DIAGNOSIS — Z7984 Long term (current) use of oral hypoglycemic drugs: Secondary | ICD-10-CM | POA: Diagnosis not present

## 2024-03-08 DIAGNOSIS — E038 Other specified hypothyroidism: Secondary | ICD-10-CM

## 2024-03-08 DIAGNOSIS — Z7985 Long-term (current) use of injectable non-insulin antidiabetic drugs: Secondary | ICD-10-CM

## 2024-03-08 DIAGNOSIS — E119 Type 2 diabetes mellitus without complications: Secondary | ICD-10-CM

## 2024-03-08 NOTE — Assessment & Plan Note (Signed)
 Last Hemoglobin A1c: 8.6 Labs: Ordered today, results pending; will follow up accordingly. The patient reports adhering to prescribed medications: Metformin  1000 mg once daily Glipizide  10 mg once daily and Weekly trulicity  4.5 mg injections    Reviewed non-pharmacological interventions, including a balanced diet rich in lean proteins, healthy fats, whole grains, and high-fiber vegetables. Emphasized reducing refined sugars and processed carbohydrates, and incorporating more fruits, leafy greens, and legumes. Education: Patient was educated on recognizing signs and symptoms of both hypoglycemia and hyperglycemia, and advised to seek emergency care if these symptoms occur. Follow-Up: Scheduled for follow-up in 4 months, or sooner if needed. Patient Understanding: The patient verbalized understanding of the care plan, and all questions were answered. Additional Care: Ophthalmology exam appointment scheduled  Foot exam results were within normal limits.

## 2024-03-08 NOTE — Patient Instructions (Signed)

## 2024-03-08 NOTE — Progress Notes (Signed)
 Established Patient Office Visit   Subjective  Patient ID: Brittney Caldwell, female    DOB: 28-Sep-1981  Age: 43 y.o. MRN: [id number]  Chief Complaint  Patient presents with   Medical Management of Chronic Issues    DM F/U  Tired due to extended period     She  has a past medical history of Hyperlipidemia and Type 2 diabetes mellitus (HCC).  Diabetes She presents for her follow-up diabetic visit. She has type 2 diabetes mellitus. Pertinent negatives for hypoglycemia include no dizziness, sweats or tremors. Associated symptoms include fatigue. Pertinent negatives for diabetes include no blurred vision, no chest pain, no foot ulcerations and no visual change. Pertinent negatives for hypoglycemia complications include no blackouts. Symptoms are improving. Pertinent negatives for diabetic complications include no peripheral neuropathy. Risk factors for coronary artery disease include dyslipidemia and obesity. Current diabetic treatment includes diet and oral agent (dual therapy) (Trulictiy weekly injections). She is compliant with treatment all of the time. She is following a diabetic diet. Meal planning includes avoidance of concentrated sweets and calorie counting. She rarely participates in exercise. She does not see a podiatrist.Eye exam is not current.    Review of Systems  Constitutional:  Positive for fatigue. Negative for chills and fever.  Eyes:  Negative for blurred vision.  Respiratory:  Negative for shortness of breath.   Cardiovascular:  Negative for chest pain.  Gastrointestinal:  Negative for abdominal pain.  Genitourinary:  Negative for dysuria.  Neurological:  Negative for dizziness and tremors.      Objective:     BP 112/75   Pulse 89   Ht 5' 4.5" (1.638 m)   Wt 203 lb (92.1 kg)   LMP 02/11/2024 (Approximate)   BMI 34.31 kg/m  BP Readings from Last 3 Encounters:  03/08/24 112/75  02/22/24 135/88  01/27/24 137/79      Physical Exam Vitals reviewed.   Constitutional:      General: She is not in acute distress.    Appearance: Normal appearance. She is not ill-appearing, toxic-appearing or diaphoretic.  HENT:     Head: Normocephalic.  Eyes:     General:        Right eye: No discharge.        Left eye: No discharge.     Conjunctiva/sclera: Conjunctivae normal.  Cardiovascular:     Rate and Rhythm: Normal rate.     Pulses: Normal pulses.     Heart sounds: Normal heart sounds.  Pulmonary:     Effort: Pulmonary effort is normal. No respiratory distress.     Breath sounds: Normal breath sounds.  Abdominal:     General: Bowel sounds are normal.     Palpations: Abdomen is soft.     Tenderness: There is no abdominal tenderness. There is no guarding.  Musculoskeletal:     Cervical back: Normal range of motion.  Skin:    General: Skin is warm and dry.     Capillary Refill: Capillary refill takes less than 2 seconds.  Neurological:     Mental Status: She is alert.     Coordination: Coordination normal.     Gait: Gait normal.  Psychiatric:        Mood and Affect: Mood normal.        Behavior: Behavior normal.      No results found for any visits on 03/08/24.  The ASCVD Risk score (Arnett DK, et al., 2019) failed to calculate for the following reasons:   The valid  total cholesterol range is 130 to 320 mg/dL    Assessment & Plan:  Mixed hyperlipidemia -     BMP8+eGFR -     Lipid panel -     CBC with Differential/Platelet  TSH (thyroid-stimulating hormone deficiency) -     TSH + free T4  Vitamin D  deficiency -     VITAMIN D  25 Hydroxy (Vit-D Deficiency, Fractures)  Type 2 diabetes mellitus without complication, without long-term current use of insulin (HCC) Assessment & Plan: Last Hemoglobin A1c: 8.6 Labs: Ordered today, results pending; will follow up accordingly. The patient reports adhering to prescribed medications: Metformin  1000 mg once daily Glipizide  10 mg once daily and Weekly trulicity  4.5 mg injections     Reviewed non-pharmacological interventions, including a balanced diet rich in lean proteins, healthy fats, whole grains, and high-fiber vegetables. Emphasized reducing refined sugars and processed carbohydrates, and incorporating more fruits, leafy greens, and legumes. Education: Patient was educated on recognizing signs and symptoms of both hypoglycemia and hyperglycemia, and advised to seek emergency care if these symptoms occur. Follow-Up: Scheduled for follow-up in 4 months, or sooner if needed. Patient Understanding: The patient verbalized understanding of the care plan, and all questions were answered. Additional Care: Ophthalmology exam appointment scheduled  Foot exam results were within normal limits.   Orders: -     Hemoglobin A1c  Vitamin B12 deficiency -     Vitamin B12    Return in about 4 months (around 07/08/2024), or if symptoms worsen or fail to improve, for type 2 diabetes.   Avelino Lek Amber Bail, FNP

## 2024-03-09 ENCOUNTER — Encounter: Payer: Self-pay | Admitting: Family Medicine

## 2024-03-09 ENCOUNTER — Other Ambulatory Visit: Payer: Self-pay | Admitting: Family Medicine

## 2024-03-09 LAB — CBC WITH DIFFERENTIAL/PLATELET
Basophils Absolute: 0.1 10*3/uL (ref 0.0–0.2)
Basos: 0 %
EOS (ABSOLUTE): 0.1 10*3/uL (ref 0.0–0.4)
Eos: 1 %
Hematocrit: 44.7 % (ref 34.0–46.6)
Hemoglobin: 15.1 g/dL (ref 11.1–15.9)
Immature Grans (Abs): 0 10*3/uL (ref 0.0–0.1)
Immature Granulocytes: 0 %
Lymphocytes Absolute: 3 10*3/uL (ref 0.7–3.1)
Lymphs: 24 %
MCH: 31.8 pg (ref 26.6–33.0)
MCHC: 33.8 g/dL (ref 31.5–35.7)
MCV: 94 fL (ref 79–97)
Monocytes Absolute: 0.5 10*3/uL (ref 0.1–0.9)
Monocytes: 4 %
Neutrophils Absolute: 8.6 10*3/uL — ABNORMAL HIGH (ref 1.4–7.0)
Neutrophils: 71 %
Platelets: 206 10*3/uL (ref 150–450)
RBC: 4.75 x10E6/uL (ref 3.77–5.28)
RDW: 13.1 % (ref 11.7–15.4)
WBC: 12.2 10*3/uL — ABNORMAL HIGH (ref 3.4–10.8)

## 2024-03-09 LAB — BMP8+EGFR
BUN/Creatinine Ratio: 8 — ABNORMAL LOW (ref 9–23)
BUN: 6 mg/dL (ref 6–24)
CO2: 19 mmol/L — ABNORMAL LOW (ref 20–29)
Calcium: 9.7 mg/dL (ref 8.7–10.2)
Chloride: 108 mmol/L — ABNORMAL HIGH (ref 96–106)
Creatinine, Ser: 0.76 mg/dL (ref 0.57–1.00)
Glucose: 217 mg/dL — ABNORMAL HIGH (ref 70–99)
Potassium: 4.5 mmol/L (ref 3.5–5.2)
Sodium: 141 mmol/L (ref 134–144)
eGFR: 100 mL/min/{1.73_m2} (ref >59)

## 2024-03-09 LAB — LIPID PANEL
Chol/HDL Ratio: 3.6 ratio (ref 0.0–4.4)
Cholesterol, Total: 114 mg/dL (ref 100–199)
HDL: 32 mg/dL — ABNORMAL LOW (ref >39)
LDL Chol Calc (NIH): 66 mg/dL (ref 0–99)
Triglycerides: 77 mg/dL (ref 0–149)
VLDL Cholesterol Cal: 16 mg/dL (ref 5–40)

## 2024-03-09 LAB — TSH+FREE T4
Free T4: 1.13 ng/dL (ref 0.82–1.77)
TSH: 1.14 u[IU]/mL (ref 0.450–4.500)

## 2024-03-09 LAB — HEMOGLOBIN A1C
Est. average glucose Bld gHb Est-mCnc: 200 mg/dL
Hgb A1c MFr Bld: 8.6 % — ABNORMAL HIGH (ref 4.8–5.6)

## 2024-03-09 LAB — VITAMIN B12: Vitamin B-12: 250 pg/mL (ref 232–1245)

## 2024-03-09 LAB — VITAMIN D 25 HYDROXY (VIT D DEFICIENCY, FRACTURES): Vit D, 25-Hydroxy: 12.8 ng/mL — ABNORMAL LOW (ref 30.0–100.0)

## 2024-03-09 MED ORDER — GLIPIZIDE 10 MG PO TABS: 10.0000 mg | ORAL_TABLET | Freq: Two times a day (BID) | ORAL | 3 refills | Status: DC

## 2024-03-13 ENCOUNTER — Other Ambulatory Visit: Payer: Self-pay | Admitting: Family Medicine

## 2024-03-13 DIAGNOSIS — E119 Type 2 diabetes mellitus without complications: Secondary | ICD-10-CM

## 2024-04-05 ENCOUNTER — Ambulatory Visit (INDEPENDENT_AMBULATORY_CARE_PROVIDER_SITE_OTHER)

## 2024-04-05 DIAGNOSIS — Z111 Encounter for screening for respiratory tuberculosis: Secondary | ICD-10-CM

## 2024-04-05 NOTE — Progress Notes (Signed)
 Patient is in office today for a nurse visit for PPD. Patient Injection was given in the  Right arm. Patient tolerated injection well.

## 2024-04-07 ENCOUNTER — Encounter: Payer: Self-pay | Admitting: Family Medicine

## 2024-04-07 LAB — TB SKIN TEST
Induration: 0 mm
TB Skin Test: NEGATIVE

## 2024-05-18 ENCOUNTER — Other Ambulatory Visit: Payer: Self-pay | Admitting: Family Medicine

## 2024-05-18 DIAGNOSIS — E119 Type 2 diabetes mellitus without complications: Secondary | ICD-10-CM

## 2024-05-27 ENCOUNTER — Other Ambulatory Visit: Payer: Self-pay | Admitting: Family Medicine

## 2024-07-12 ENCOUNTER — Ambulatory Visit: Admitting: Family Medicine

## 2024-07-12 ENCOUNTER — Encounter: Payer: Self-pay | Admitting: Family Medicine

## 2024-07-12 VITALS — BP 135/88 | HR 92 | Ht 64.5 in | Wt 199.0 lb

## 2024-07-12 DIAGNOSIS — E782 Mixed hyperlipidemia: Secondary | ICD-10-CM

## 2024-07-12 DIAGNOSIS — Z7984 Long term (current) use of oral hypoglycemic drugs: Secondary | ICD-10-CM | POA: Diagnosis not present

## 2024-07-12 DIAGNOSIS — E119 Type 2 diabetes mellitus without complications: Secondary | ICD-10-CM

## 2024-07-12 NOTE — Assessment & Plan Note (Signed)
 Last Hemoglobin A1c: 8.6 Labs: Ordered today, results pending; will follow up accordingly. The patient reports adhering to prescribed medications: Metformin  1000 mg twice daily Glipizide  10 mg once daily and Weekly trulicity  4.5 mg injections    Reviewed non-pharmacological interventions, including a balanced diet rich in lean proteins, healthy fats, whole grains, and high-fiber vegetables. Emphasized reducing refined sugars and processed carbohydrates, and incorporating more fruits, leafy greens, and legumes. Education: Patient was educated on recognizing signs and symptoms of both hypoglycemia and hyperglycemia, and advised to seek emergency care if these symptoms occur. Follow-Up: Scheduled for follow-up in 4 months, or sooner if needed. Patient Understanding: The patient verbalized understanding of the care plan, and all questions were answered. Additional Care: Ophthalmology referral placed.  Foot exam results were within normal limits.

## 2024-07-12 NOTE — Patient Instructions (Signed)

## 2024-07-12 NOTE — Progress Notes (Signed)
 Established Patient Office Visit   Subjective  Patient ID: Brittney Caldwell, female    DOB: [birth year]  Age: 43 y.o. MRN: [id number]  Chief Complaint  Patient presents with   Diabetes    Four month follow up   Stye    Stye for one week on left eye, has been using warm compresses     She  has a past medical history of Hyperlipidemia and Type 2 diabetes mellitus (HCC).  HPI Patient presents to the clinic for chronic follow up. For the details of today's visit, please refer to assessment and plan.   Review of Systems  Constitutional:  Negative for chills and fever.  Eyes:  Negative for blurred vision.  Respiratory:  Negative for shortness of breath.   Cardiovascular:  Negative for chest pain.  Gastrointestinal:  Negative for abdominal pain.  Neurological:  Negative for dizziness and headaches.      Objective:     BP 135/88   Pulse 92   Ht 5' 4.5 (1.638 m)   Wt 199 lb (90.3 kg)   SpO2 98%   BMI 33.63 kg/m  BP Readings from Last 3 Encounters:  07/12/24 135/88  03/08/24 112/75  02/22/24 135/88      Physical Exam Vitals reviewed.  Constitutional:      General: She is not in acute distress.    Appearance: Normal appearance. She is not ill-appearing, toxic-appearing or diaphoretic.  HENT:     Head: Normocephalic.  Eyes:     General:        Right eye: No discharge.        Left eye: No discharge.     Conjunctiva/sclera: Conjunctivae normal.  Cardiovascular:     Rate and Rhythm: Normal rate.     Pulses: Normal pulses.     Heart sounds: Normal heart sounds.  Pulmonary:     Effort: Pulmonary effort is normal. No respiratory distress.     Breath sounds: Normal breath sounds.  Abdominal:     General: Bowel sounds are normal.     Palpations: Abdomen is soft.     Tenderness: There is no abdominal tenderness. There is no right CVA tenderness, left CVA tenderness or guarding.  Skin:    General: Skin is warm and dry.     Capillary Refill: Capillary refill takes less  than 2 seconds.  Neurological:     Mental Status: She is alert.     Coordination: Coordination normal.     Gait: Gait normal.  Psychiatric:        Mood and Affect: Mood normal.        Behavior: Behavior normal.      No results found for any visits on 07/12/24.  The ASCVD Risk score (Arnett DK, et al., 2019) failed to calculate for the following reasons:   The valid total cholesterol range is 130 to 320 mg/dL    Assessment & Plan:  Type 2 diabetes mellitus without complication, without long-term current use of insulin (HCC) Assessment & Plan: Last Hemoglobin A1c: 8.6 Labs: Ordered today, results pending; will follow up accordingly. The patient reports adhering to prescribed medications: Metformin  1000 mg twice daily Glipizide  10 mg once daily and Weekly trulicity  4.5 mg injections    Reviewed non-pharmacological interventions, including a balanced diet rich in lean proteins, healthy fats, whole grains, and high-fiber vegetables. Emphasized reducing refined sugars and processed carbohydrates, and incorporating more fruits, leafy greens, and legumes. Education: Patient was educated on recognizing signs and symptoms of  both hypoglycemia and hyperglycemia, and advised to seek emergency care if these symptoms occur. Follow-Up: Scheduled for follow-up in 4 months, or sooner if needed. Patient Understanding: The patient verbalized understanding of the care plan, and all questions were answered. Additional Care: Ophthalmology referral placed.  Foot exam results were within normal limits.  Orders: -     Microalbumin / creatinine urine ratio -     Ambulatory referral to Ophthalmology -     Hemoglobin A1c  Mixed hyperlipidemia -     Lipid panel -     BMP8+eGFR -     CBC with Differential/Platelet    Return in about 4 months (around 11/11/2024), or if symptoms worsen or fail to improve, for type 2 diabetes.   Hilario Kidd Wilhelmena Falter, FNP

## 2024-07-13 ENCOUNTER — Ambulatory Visit: Payer: Self-pay | Admitting: Family Medicine

## 2024-07-13 ENCOUNTER — Other Ambulatory Visit: Payer: Self-pay | Admitting: Family Medicine

## 2024-07-13 LAB — BMP8+EGFR
BUN/Creatinine Ratio: 7 — ABNORMAL LOW (ref 9–23)
BUN: 6 mg/dL (ref 6–24)
CO2: 19 mmol/L — ABNORMAL LOW (ref 20–29)
Calcium: 9.8 mg/dL (ref 8.7–10.2)
Chloride: 103 mmol/L (ref 96–106)
Creatinine, Ser: 0.81 mg/dL (ref 0.57–1.00)
Glucose: 115 mg/dL — ABNORMAL HIGH (ref 70–99)
Potassium: 4.2 mmol/L (ref 3.5–5.2)
Sodium: 140 mmol/L (ref 134–144)
eGFR: 93 mL/min/1.73 (ref >59)

## 2024-07-13 LAB — CBC WITH DIFFERENTIAL/PLATELET
Basophils Absolute: 0 x10E3/uL (ref 0.0–0.2)
Basos: 0 %
EOS (ABSOLUTE): 0.1 x10E3/uL (ref 0.0–0.4)
Eos: 1 %
Hematocrit: 45.9 % (ref 34.0–46.6)
Hemoglobin: 14.4 g/dL (ref 11.1–15.9)
Immature Grans (Abs): 0 x10E3/uL (ref 0.0–0.1)
Immature Granulocytes: 0 %
Lymphocytes Absolute: 2.9 x10E3/uL (ref 0.7–3.1)
Lymphs: 29 %
MCH: 28.7 pg (ref 26.6–33.0)
MCHC: 31.4 g/dL — ABNORMAL LOW (ref 31.5–35.7)
MCV: 92 fL (ref 79–97)
Monocytes Absolute: 0.4 x10E3/uL (ref 0.1–0.9)
Monocytes: 4 %
Neutrophils Absolute: 6.4 x10E3/uL (ref 1.4–7.0)
Neutrophils: 66 %
Platelets: 233 x10E3/uL (ref 150–450)
RBC: 5.01 x10E6/uL (ref 3.77–5.28)
RDW: 13.3 % (ref 11.7–15.4)
WBC: 9.9 x10E3/uL (ref 3.4–10.8)

## 2024-07-13 LAB — LIPID PANEL
Chol/HDL Ratio: 3.6 ratio (ref 0.0–4.4)
Cholesterol, Total: 131 mg/dL (ref 100–199)
HDL: 36 mg/dL — ABNORMAL LOW (ref >39)
LDL Chol Calc (NIH): 72 mg/dL (ref 0–99)
Triglycerides: 127 mg/dL (ref 0–149)
VLDL Cholesterol Cal: 23 mg/dL (ref 5–40)

## 2024-07-13 LAB — HEMOGLOBIN A1C
Est. average glucose Bld gHb Est-mCnc: 171 mg/dL
Hgb A1c MFr Bld: 7.6 % — ABNORMAL HIGH (ref 4.8–5.6)

## 2024-07-13 LAB — MICROALBUMIN / CREATININE URINE RATIO
Creatinine, Urine: 289.3 mg/dL
Microalb/Creat Ratio: 4 mg/g{creat} (ref 0–29)
Microalbumin, Urine: 12 ug/mL

## 2024-07-13 MED ORDER — GLIPIZIDE 10 MG PO TABS: 10.0000 mg | ORAL_TABLET | Freq: Two times a day (BID) | ORAL | 3 refills | Status: AC

## 2024-08-10 ENCOUNTER — Ambulatory Visit

## 2024-08-11 ENCOUNTER — Ambulatory Visit (INDEPENDENT_AMBULATORY_CARE_PROVIDER_SITE_OTHER)

## 2024-08-11 DIAGNOSIS — Z23 Encounter for immunization: Secondary | ICD-10-CM | POA: Diagnosis not present

## 2024-08-11 NOTE — Progress Notes (Signed)
 Patient is in office today for a nurse visit for Immunization. Patient Injection was given in the  Right deltoid. Patient tolerated injection well.

## 2024-09-06 ENCOUNTER — Other Ambulatory Visit: Payer: Self-pay | Admitting: Family Medicine

## 2024-09-06 DIAGNOSIS — E119 Type 2 diabetes mellitus without complications: Secondary | ICD-10-CM

## 2024-10-14 ENCOUNTER — Other Ambulatory Visit: Payer: Self-pay | Admitting: Family Medicine

## 2024-10-14 DIAGNOSIS — E1165 Type 2 diabetes mellitus with hyperglycemia: Secondary | ICD-10-CM

## 2024-10-24 ENCOUNTER — Ambulatory Visit: Payer: Self-pay

## 2024-10-24 NOTE — Telephone Encounter (Signed)
 FYI Only or Action Required?: Action required by provider: medication refill request and appointment scheduled for 10/25/24.  Patient was last seen in primary care on 07/12/2024 by Terry Wilhelmena Lloyd Hilario, FNP.  Called Nurse Triage reporting Back Pain.  Symptoms began several days ago.  Interventions attempted: OTC medications: Tylenol , ibuprofen  and Icy Hot and Other: massaging area provides relief.  Symptoms are: unchanged.  Triage Disposition: See PCP When Office is Open (Within 3 Days)  Patient/caregiver understands and will follow disposition?: Yes                               1. ONSET: When did the pain begin? (e.g., minutes, hours, days)     3 days ago 2. LOCATION: Where does it hurt? (upper, mid or lower back)     Lower left side of back  3. SEVERITY: How bad is the pain?  (e.g., Scale 1-10; mild, moderate, or severe)     Rates pain and 8, describes pain as sharp 4. PATTERN: Is the pain constant? (e.g., yes, no; constant, intermittent)      Intermittent  5. RADIATION: Does the pain shoot into your legs or somewhere else?     Denies radiation Reports intermittent pain in left leg at times, denies leg pain at this time 6. CAUSE:  What do you think is causing the back pain?      Unsure, possibly trapped gas 7. BACK OVERUSE:  Any recent lifting of heavy objects, strenuous work or exercise?     Denies 8. MEDICINES: What have you taken so far for the pain? (e.g., nothing, acetaminophen , NSAIDS)     Tylenol , ibuprofen  and Icy Hot 9. NEUROLOGIC SYMPTOMS: Do you have any weakness, numbness, or problems with bowel/bladder control?     Denies numbness, denies weakness in legs, denies issues using restroom 10. OTHER SYMPTOMS: Do you have any other symptoms? (e.g., fever, abdomen pain, burning with urination, blood in urine)     Denies abdominal pain, denies painful urination, denies fever 11. PREGNANCY: Is there any chance you are  pregnant? When was your last menstrual period?     Menstrual cycle 4 days ago, IUD in place   This RN advised in-person evaluation of symptoms. Patient scheduled for tomorrow with alternate provider in PCP office.   Patient also stated she has not been able to get her Trulicity  refilled and has not taken it in 2 weeks. Please advise.   Copied from CRM #[id number]. Topic: Clinical - Red Word Triage >> Oct 24, 2024 12:37 PM Winona R wrote: New sharp pain left side of back, comes and goes randomly would like to resch her appointment from the 29th to the 30th if possible as she will be working out of town on the 29 and off on the 30th  Reason for Disposition  [1] MODERATE back pain (e.g., interferes with normal activities) AND [2] present > 3 days  Protocols used: Back Pain-A-AH

## 2024-10-25 ENCOUNTER — Ambulatory Visit: Admitting: Internal Medicine

## 2024-10-25 ENCOUNTER — Encounter: Payer: Self-pay | Admitting: Internal Medicine

## 2024-10-25 VITALS — BP 113/78 | HR 94 | Ht 64.5 in | Wt 209.2 lb

## 2024-10-25 DIAGNOSIS — M545 Low back pain, unspecified: Secondary | ICD-10-CM

## 2024-10-25 DIAGNOSIS — E1169 Type 2 diabetes mellitus with other specified complication: Secondary | ICD-10-CM

## 2024-10-25 MED ORDER — CYCLOBENZAPRINE HCL 5 MG PO TABS: 5.0000 mg | ORAL_TABLET | Freq: Two times a day (BID) | ORAL | 0 refills | Status: AC | PRN

## 2024-10-25 MED ORDER — KETOROLAC TROMETHAMINE 60 MG/2ML IM SOLN
60.0000 mg | Freq: Once | INTRAMUSCULAR | Status: AC
  Administered 2024-10-25: 60 mg via INTRAMUSCULAR

## 2024-10-25 MED ORDER — TRULICITY 4.5 MG/0.5ML ~~LOC~~ SOAJ: 4.5000 mg | SUBCUTANEOUS | 5 refills | Status: AC

## 2024-10-25 MED ORDER — PREDNISONE 20 MG PO TABS: 40.0000 mg | ORAL_TABLET | Freq: Every day | ORAL | 0 refills | Status: AC

## 2024-10-25 NOTE — Assessment & Plan Note (Signed)
 Low back pain likely due to muscular strain Advised to avoid heavy lifting and frequent bending Heating pad and/or back brace as needed Toradol IM today Oral prednisone 40 mg QD x 5 days Flexeril  as needed for muscle spasms

## 2024-10-25 NOTE — Progress Notes (Signed)
 Acute Office Visit  Subjective:    Patient ID: Brittney Caldwell, female    DOB: 10/23/1981, 43 y.o.   MRN: [id number]  Chief Complaint  Patient presents with   Back Pain    Pt reports sx of low back pain   Diabetes    Needs refill on trulicity .    HPI Patient is in today for complaint of left sided low back pain for the last 5 days.  Her pain is intermittent, sharp, nonradiating and worse with prolonged standing or movement.  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  She denies any recent injury or fall.  She has tried taking Tylenol  and ibuprofen  with mild relief.  Denies any numbness or tingling of the LE.  Denies dysuria, hematuria or suprapubic pain.  She takes Trulicity  for type II DM, and requests refill of it.  Past Medical History:  Diagnosis Date   Hyperlipidemia    Type 2 diabetes mellitus (HCC)     History reviewed. No pertinent surgical history.  Family History  Problem Relation Age of Onset   Hypertension Mother    Hyperlipidemia Mother    Diabetes Mother    Hypertension Father    Hyperlipidemia Father    Diabetes Father    Asthma Son    Diabetes Maternal Grandmother    Diabetes Paternal Grandfather     Social History   Socioeconomic History   Marital status: Single    Spouse name: Not on file   Number of children: Not on file   Years of education: Not on file   Highest education level: Some college, no degree  Occupational History   Not on file  Tobacco Use   Smoking status: Every Day    Current packs/day: 0.25    Average packs/day: 0.3 packs/day for 15.0 years (3.8 ttl pk-yrs)    Types: Cigarettes   Smokeless tobacco: Never  Vaping Use   Vaping status: Never Used  Substance and Sexual Activity   Alcohol use: Yes    Alcohol/week: 0.0 standard drinks of alcohol    Comment: occasional   Drug use: Not Currently    Types: Marijuana    Comment: 2 weeks ago   Sexual activity:  Yes    Birth control/protection: None  Other Topics Concern   Not on file  Social History Narrative   Not on file   Social Drivers of Health   Financial Resource Strain: Low Risk  (10/24/2024)   Overall Financial Resource Strain (CARDIA)    Difficulty of Paying Living Expenses: Not very hard  Food Insecurity: Food Insecurity Present (10/24/2024)   Hunger Vital Sign    Worried About Running Out of Food in the Last Year: Sometimes true    Ran Out of Food in the Last Year: Never true  Transportation Needs: No Transportation Needs (10/24/2024)   PRAPARE - Administrator, Civil Service (Medical): No    Lack of Transportation (Non-Medical): No  Physical Activity: Insufficiently Active (10/24/2024)   Exercise Vital Sign    Days of Exercise per Week: 2 days    Minutes of Exercise per Session: 30 min  Stress: No Stress Concern Present (10/24/2024)   [employer] of Occupational Health - Occupational Stress Questionnaire    Feeling of Stress: Only a little  Social Connections: Moderately Isolated (10/24/2024)   Social Connection and Isolation Panel    Frequency of Communication with Friends and  Family: More than three times a week    Frequency of Social Gatherings with Friends and Family: More than three times a week    Attends Religious Services: 1 to 4 times per year    Active Member of [employer] or Organizations: No    Attends Engineer, Structural: Not on file    Marital Status: Never married  Intimate Partner Violence: Not At Risk (02/22/2024)   Humiliation, Afraid, Rape, and Kick questionnaire    Fear of Current or Ex-Partner: No    Emotionally Abused: No    Physically Abused: No    Sexually Abused: No    Outpatient Medications Prior to Visit  Medication Sig Dispense Refill   Accu-Chek Softclix Lancets lancets SMARTSIG:Topical     glipiZIDE  (GLUCOTROL ) 10 MG tablet Take 1 tablet (10 mg total) by mouth 2 (two) times daily before a meal. 60 tablet 3   ibuprofen   (ADVIL ,MOTRIN ) 200 MG tablet Take 400 mg by mouth every 6 (six) hours as needed.     Lancets Misc. (ACCU-CHEK SOFTCLIX LANCET DEV) KIT USE TO CHECK BLOOD GLUCOSE THREE TIMES DAILY     levocetirizine (XYZAL ) 5 MG tablet Take 1 tablet (5 mg total) by mouth every evening. 30 tablet 2   metFORMIN  (GLUCOPHAGE ) 1000 MG tablet TAKE 1 TABLET(1000 MG) BY MOUTH TWICE DAILY WITH A MEAL 90 tablet 3   rosuvastatin  (CRESTOR ) 5 MG tablet TAKE 1 TABLET(5 MG) BY MOUTH DAILY 90 tablet 3   TRULICITY  4.5 MG/0.5ML SOAJ ADMINISTER 4.5 MG UNDER THE SKIN 1 TIME A WEEK 2 mL 3   Blood Glucose Monitoring Suppl (ACCU-CHEK GUIDE ME) w/Device KIT USE TO CHECK BLOOD GLUCOSE THREE TIMES DAILY     Blood Glucose Monitoring Suppl DEVI 1 each by Does not apply route in the morning, at noon, and at bedtime. May substitute to any manufacturer covered by patient's insurance. 1 each 0   megestrol  (MEGACE ) 40 MG tablet 1 tablet daily for 24 days, then off 4 days(mimic progesterone only birth control pill cycling) 24 tablet 12   No facility-administered medications prior to visit.    Allergies  Allergen Reactions   Penicillins Hives    Has patient had a PCN reaction causing immediate rash, facial/tongue/throat swelling, SOB or lightheadedness with hypotension: Yes Has patient had a PCN reaction causing severe rash involving mucus membranes or skin necrosis: No Has patient had a PCN reaction that required hospitalization No Has patient had a PCN reaction occurring within the last 10 years: No If all of the above answers are NO, then may proceed with Cephalosporin use.     Review of Systems  Constitutional:  Negative for chills and fever.  HENT:  Negative for congestion, sinus pressure, sinus pain and sore throat.   Eyes:  Negative for pain and discharge.  Respiratory:  Negative for cough and shortness of breath.   Cardiovascular:  Negative for chest pain and palpitations.  Gastrointestinal:  Negative for abdominal pain,  diarrhea, nausea and vomiting.  Endocrine: Negative for polydipsia and polyuria.  Genitourinary:  Negative for dysuria and hematuria.  Musculoskeletal:  Positive for back pain. Negative for neck pain and neck stiffness.  Skin:  Negative for rash.  Neurological:  Negative for dizziness and weakness.  Psychiatric/Behavioral:  Negative for agitation and behavioral problems.        Objective:    Physical Exam Vitals reviewed.  Constitutional:      General: She is not in acute distress.    Appearance: She is  obese. She is not diaphoretic.  HENT:     Head: Normocephalic and atraumatic.     Nose: Nose normal.     Mouth/Throat:     Mouth: Mucous membranes are moist.  Eyes:     General: No scleral icterus.    Extraocular Movements: Extraocular movements intact.  Cardiovascular:     Rate and Rhythm: Normal rate and regular rhythm.     Heart sounds: Normal heart sounds. No murmur heard. Pulmonary:     Breath sounds: Normal breath sounds. No wheezing or rales.  Musculoskeletal:     Cervical back: Neck supple. No tenderness.     Lumbar back: Tenderness (Left paraspinal) present. Normal range of motion. Negative right straight leg raise test and negative left straight leg raise test.     Right lower leg: No edema.     Left lower leg: No edema.  Skin:    General: Skin is warm.     Findings: No rash.  Neurological:     General: No focal deficit present.     Mental Status: She is alert and oriented to person, place, and time.  Psychiatric:        Mood and Affect: Mood normal.        Behavior: Behavior normal.     BP 113/78   Pulse 94   Ht 5' 4.5 (1.638 m)   Wt 209 lb 3.2 oz (94.9 kg)   SpO2 100%   BMI 35.35 kg/m  Wt Readings from Last 3 Encounters:  10/25/24 209 lb 3.2 oz (94.9 kg)  07/12/24 199 lb (90.3 kg)  03/08/24 203 lb (92.1 kg)        Assessment & Plan:   Problem List Items Addressed This Visit       Endocrine   Type 2 diabetes mellitus with other specified  complication   Lab Results  Component Value Date   HGBA1C 7.6 (H) 07/12/2024   Uncontrolled, but improving Associated with HLD On Trulicity  4.5 mg QW and glipizide  10 mg BID Advised to follow diabetic diet On statin      Relevant Medications   Dulaglutide  (TRULICITY ) 4.5 MG/0.5ML SOAJ   predniSONE (DELTASONE) 20 MG tablet     Other   Acute left-sided low back pain without sciatica - Primary   Low back pain likely due to muscular strain Advised to avoid heavy lifting and frequent bending Heating pad and/or back brace as needed Toradol IM today Oral prednisone 40 mg QD x 5 days Flexeril  as needed for muscle spasms      Relevant Medications   predniSONE (DELTASONE) 20 MG tablet   cyclobenzaprine  (FLEXERIL ) 5 MG tablet     Meds ordered this encounter  Medications   Dulaglutide  (TRULICITY ) 4.5 MG/0.5ML SOAJ    Sig: Inject 4.5 mg into the skin every 7 (seven) days.    Dispense:  2 mL    Refill:  5   predniSONE (DELTASONE) 20 MG tablet    Sig: Take 2 tablets (40 mg total) by mouth daily with breakfast.    Dispense:  10 tablet    Refill:  0   cyclobenzaprine  (FLEXERIL ) 5 MG tablet    Sig: Take 1 tablet (5 mg total) by mouth 2 (two) times daily as needed.    Dispense:  30 tablet    Refill:  0   ketorolac (TORADOL) injection 60 mg     Suzzane MARLA Blanch, MD

## 2024-10-25 NOTE — Assessment & Plan Note (Signed)
 Lab Results  Component Value Date   HGBA1C 7.6 (H) 07/12/2024   Uncontrolled, but improving Associated with HLD On Trulicity  4.5 mg QW and glipizide  10 mg BID Advised to follow diabetic diet On statin

## 2024-10-25 NOTE — Patient Instructions (Signed)
 Please start taking Prednisone as prescribed.  Please take Flexeril  as needed for muscle spasms/stiffness.  Please use heating pad and/or back brace as needed for back pain.  Please avoid heavy lifting and frequent bending.

## 2024-11-13 ENCOUNTER — Ambulatory Visit: Admitting: Family Medicine
